# Patient Record
Sex: Male | Born: 1945 | Race: White | Hispanic: No | Smoking: Current every day smoker
Health system: Southern US, Community
[De-identification: ages and names within clinical notes are randomized; demographics above are authoritative.]

## PROBLEM LIST (undated history)

## (undated) DIAGNOSIS — I219 Acute myocardial infarction, unspecified: Secondary | ICD-10-CM

## (undated) DIAGNOSIS — I2510 Atherosclerotic heart disease of native coronary artery without angina pectoris: Secondary | ICD-10-CM

## (undated) DIAGNOSIS — I719 Aortic aneurysm of unspecified site, without rupture: Secondary | ICD-10-CM

## (undated) DIAGNOSIS — I509 Heart failure, unspecified: Secondary | ICD-10-CM

## (undated) DIAGNOSIS — Z951 Presence of aortocoronary bypass graft: Secondary | ICD-10-CM

## (undated) DIAGNOSIS — E119 Type 2 diabetes mellitus without complications: Secondary | ICD-10-CM

## (undated) HISTORY — PX: CORONARY ARTERY BYPASS GRAFT: SHX141

## (undated) HISTORY — PX: CARDIAC SURGERY: SHX584

---

## 2016-07-13 ENCOUNTER — Emergency Department: Payer: Medicare Other

## 2016-07-13 ENCOUNTER — Inpatient Hospital Stay
Admission: EM | Admit: 2016-07-13 | Discharge: 2016-07-20 | DRG: 292 | Disposition: A | Discharge status: Home / Self Care | Payer: Medicare Other | Attending: Internal Medicine | Admitting: Internal Medicine

## 2016-07-13 DIAGNOSIS — I4891 Unspecified atrial fibrillation: Secondary | ICD-10-CM | POA: Diagnosis present

## 2016-07-13 DIAGNOSIS — Z6832 Body mass index (BMI) 32.0-32.9, adult: Secondary | ICD-10-CM | POA: Diagnosis not present

## 2016-07-13 DIAGNOSIS — Z8249 Family history of ischemic heart disease and other diseases of the circulatory system: Secondary | ICD-10-CM | POA: Diagnosis not present

## 2016-07-13 DIAGNOSIS — I5023 Acute on chronic systolic (congestive) heart failure: Secondary | ICD-10-CM | POA: Diagnosis present

## 2016-07-13 DIAGNOSIS — L03116 Cellulitis of left lower limb: Secondary | ICD-10-CM | POA: Diagnosis present

## 2016-07-13 DIAGNOSIS — I252 Old myocardial infarction: Secondary | ICD-10-CM

## 2016-07-13 DIAGNOSIS — R0602 Shortness of breath: Secondary | ICD-10-CM

## 2016-07-13 DIAGNOSIS — I110 Hypertensive heart disease with heart failure: Secondary | ICD-10-CM | POA: Diagnosis not present

## 2016-07-13 DIAGNOSIS — F17210 Nicotine dependence, cigarettes, uncomplicated: Secondary | ICD-10-CM | POA: Diagnosis present

## 2016-07-13 DIAGNOSIS — I420 Dilated cardiomyopathy: Secondary | ICD-10-CM | POA: Diagnosis present

## 2016-07-13 DIAGNOSIS — M6281 Muscle weakness (generalized): Secondary | ICD-10-CM

## 2016-07-13 DIAGNOSIS — R2681 Unsteadiness on feet: Secondary | ICD-10-CM

## 2016-07-13 DIAGNOSIS — I509 Heart failure, unspecified: Secondary | ICD-10-CM

## 2016-07-13 DIAGNOSIS — L03115 Cellulitis of right lower limb: Secondary | ICD-10-CM | POA: Diagnosis present

## 2016-07-13 DIAGNOSIS — E785 Hyperlipidemia, unspecified: Secondary | ICD-10-CM | POA: Diagnosis present

## 2016-07-13 DIAGNOSIS — E669 Obesity, unspecified: Secondary | ICD-10-CM | POA: Diagnosis present

## 2016-07-13 DIAGNOSIS — I2510 Atherosclerotic heart disease of native coronary artery without angina pectoris: Secondary | ICD-10-CM | POA: Diagnosis present

## 2016-07-13 DIAGNOSIS — Z951 Presence of aortocoronary bypass graft: Secondary | ICD-10-CM

## 2016-07-13 DIAGNOSIS — E1140 Type 2 diabetes mellitus with diabetic neuropathy, unspecified: Secondary | ICD-10-CM | POA: Diagnosis present

## 2016-07-13 DIAGNOSIS — Z794 Long term (current) use of insulin: Secondary | ICD-10-CM

## 2016-07-13 DIAGNOSIS — I82409 Acute embolism and thrombosis of unspecified deep veins of unspecified lower extremity: Secondary | ICD-10-CM

## 2016-07-13 DIAGNOSIS — Z7982 Long term (current) use of aspirin: Secondary | ICD-10-CM

## 2016-07-13 DIAGNOSIS — M7989 Other specified soft tissue disorders: Secondary | ICD-10-CM

## 2016-07-13 HISTORY — DX: Heart failure, unspecified: I50.9

## 2016-07-13 HISTORY — DX: Aortic aneurysm of unspecified site, without rupture: I71.9

## 2016-07-13 HISTORY — DX: Presence of aortocoronary bypass graft: Z95.1

## 2016-07-13 HISTORY — DX: Type 2 diabetes mellitus without complications: E11.9

## 2016-07-13 HISTORY — DX: Acute myocardial infarction, unspecified: I21.9

## 2016-07-13 LAB — CBC WITH DIFFERENTIAL/PLATELET
Basophils Absolute: 0.1 10*3/uL (ref 0–0.1)
Basophils Relative: 1 %
EOS ABS: 0.2 10*3/uL (ref 0–0.7)
EOS PCT: 3 %
HCT: 41.9 % (ref 40.0–52.0)
HEMOGLOBIN: 14.1 g/dL (ref 13.0–18.0)
Lymphocytes Relative: 13 %
Lymphs Abs: 0.8 10*3/uL — ABNORMAL LOW (ref 1.0–3.6)
MCH: 34.2 pg — AB (ref 26.0–34.0)
MCHC: 33.6 g/dL (ref 32.0–36.0)
MCV: 101.9 fL — ABNORMAL HIGH (ref 80.0–100.0)
MONO ABS: 0.6 10*3/uL (ref 0.2–1.0)
MONOS PCT: 9 %
Neutro Abs: 4.6 10*3/uL (ref 1.4–6.5)
Neutrophils Relative %: 74 %
PLATELETS: 145 10*3/uL — AB (ref 150–440)
RBC: 4.11 MIL/uL — ABNORMAL LOW (ref 4.40–5.90)
RDW: 14.1 % (ref 11.5–14.5)
WBC: 6.3 10*3/uL (ref 3.8–10.6)

## 2016-07-13 LAB — COMPREHENSIVE METABOLIC PANEL
ALK PHOS: 96 U/L (ref 38–126)
ALT: 12 U/L — ABNORMAL LOW (ref 17–63)
ANION GAP: 8 (ref 5–15)
AST: 16 U/L (ref 15–41)
Albumin: 3.7 g/dL (ref 3.5–5.0)
BUN: 22 mg/dL — ABNORMAL HIGH (ref 6–20)
CALCIUM: 9.1 mg/dL (ref 8.9–10.3)
CO2: 28 mmol/L (ref 22–32)
Chloride: 99 mmol/L — ABNORMAL LOW (ref 101–111)
Creatinine, Ser: 1.13 mg/dL (ref 0.61–1.24)
GFR calc non Af Amer: >60 mL/min (ref >60)
Glucose, Bld: 118 mg/dL — ABNORMAL HIGH (ref 65–99)
Potassium: 4.1 mmol/L (ref 3.5–5.1)
SODIUM: 135 mmol/L (ref 135–145)
Total Bilirubin: 1.3 mg/dL — ABNORMAL HIGH (ref 0.3–1.2)
Total Protein: 7.1 g/dL (ref 6.5–8.1)

## 2016-07-13 LAB — URINALYSIS, COMPLETE (UACMP) WITH MICROSCOPIC
Bacteria, UA: NONE SEEN
SPECIFIC GRAVITY, URINE: 1.006 (ref 1.005–1.030)

## 2016-07-13 LAB — BRAIN NATRIURETIC PEPTIDE: B Natriuretic Peptide: 409 pg/mL — ABNORMAL HIGH (ref 0.0–100.0)

## 2016-07-13 LAB — TROPONIN I
Troponin I: <0.03 ng/mL (ref <0.03)
Troponin I: <0.03 ng/mL (ref <0.03)

## 2016-07-13 LAB — GLUCOSE, CAPILLARY: GLUCOSE-CAPILLARY: 126 mg/dL — AB (ref 65–99)

## 2016-07-13 MED ORDER — ACETAMINOPHEN 325 MG PO TABS
650.0000 mg | ORAL_TABLET | Freq: Four times a day (QID) | ORAL | Status: DC | PRN
  Administered 2016-07-20: 650 mg via ORAL
  Filled 2016-07-13: qty 2

## 2016-07-13 MED ORDER — ONDANSETRON HCL 4 MG PO TABS: 4.0000 mg | ORAL_TABLET | Freq: Four times a day (QID) | ORAL | Status: DC | PRN

## 2016-07-13 MED ORDER — DIAZEPAM 5 MG PO TABS
5.0000 mg | ORAL_TABLET | Freq: Every day | ORAL | Status: DC
  Administered 2016-07-13 – 2016-07-19 (×7): 5 mg via ORAL
  Filled 2016-07-13 (×7): qty 1

## 2016-07-13 MED ORDER — HYDROCODONE-ACETAMINOPHEN 5-325 MG PO TABS
1.0000 | ORAL_TABLET | Freq: Four times a day (QID) | ORAL | Status: DC | PRN
  Administered 2016-07-13 – 2016-07-18 (×16): 1 via ORAL
  Filled 2016-07-13 (×16): qty 1

## 2016-07-13 MED ORDER — ASPIRIN 81 MG PO CHEW
324.0000 mg | CHEWABLE_TABLET | Freq: Once | ORAL | Status: DC
  Filled 2016-07-13: qty 4

## 2016-07-13 MED ORDER — GABAPENTIN 300 MG PO CAPS
900.0000 mg | ORAL_CAPSULE | Freq: Every evening | ORAL | Status: DC
  Administered 2016-07-13 – 2016-07-19 (×8): 900 mg via ORAL
  Filled 2016-07-13 (×7): qty 3

## 2016-07-13 MED ORDER — GABAPENTIN 300 MG PO CAPS
600.0000 mg | ORAL_CAPSULE | Freq: Every morning | ORAL | Status: DC
  Administered 2016-07-14 – 2016-07-20 (×6): 600 mg via ORAL
  Filled 2016-07-13 (×8): qty 2

## 2016-07-13 MED ORDER — FUROSEMIDE 10 MG/ML IJ SOLN
60.0000 mg | Freq: Once | INTRAMUSCULAR | Status: AC
  Administered 2016-07-13: 60 mg via INTRAVENOUS
  Filled 2016-07-13: qty 8

## 2016-07-13 MED ORDER — SODIUM CHLORIDE 0.9% FLUSH
3.0000 mL | Freq: Two times a day (BID) | INTRAVENOUS | Status: DC
  Administered 2016-07-13 – 2016-07-19 (×13): 3 mL via INTRAVENOUS

## 2016-07-13 MED ORDER — SIMVASTATIN 40 MG PO TABS
40.0000 mg | ORAL_TABLET | Freq: Every day | ORAL | Status: DC
  Administered 2016-07-13 – 2016-07-20 (×8): 40 mg via ORAL
  Filled 2016-07-13 (×8): qty 1

## 2016-07-13 MED ORDER — ORAL CARE MOUTH RINSE
15.0000 mL | Freq: Two times a day (BID) | OROMUCOSAL | Status: DC
  Administered 2016-07-13 – 2016-07-19 (×10): 15 mL via OROMUCOSAL

## 2016-07-13 MED ORDER — AMLODIPINE BESYLATE 10 MG PO TABS
10.0000 mg | ORAL_TABLET | Freq: Every day | ORAL | Status: DC
  Administered 2016-07-14: 10 mg via ORAL
  Filled 2016-07-13: qty 1

## 2016-07-13 MED ORDER — ONDANSETRON HCL 4 MG/2ML IJ SOLN: 4.0000 mg | Freq: Four times a day (QID) | INTRAMUSCULAR | Status: DC | PRN

## 2016-07-13 MED ORDER — IRBESARTAN 75 MG PO TABS
300.0000 mg | ORAL_TABLET | Freq: Every day | ORAL | Status: DC
  Administered 2016-07-14: 300 mg via ORAL
  Filled 2016-07-13: qty 4

## 2016-07-13 MED ORDER — ACETAMINOPHEN 650 MG RE SUPP: 650.0000 mg | Freq: Four times a day (QID) | RECTAL | Status: DC | PRN

## 2016-07-13 MED ORDER — APIXABAN 5 MG PO TABS
5.0000 mg | ORAL_TABLET | Freq: Two times a day (BID) | ORAL | Status: DC
  Administered 2016-07-13 – 2016-07-20 (×14): 5 mg via ORAL
  Filled 2016-07-13 (×14): qty 1

## 2016-07-13 MED ORDER — CARVEDILOL 12.5 MG PO TABS
12.5000 mg | ORAL_TABLET | Freq: Two times a day (BID) | ORAL | Status: DC
  Administered 2016-07-14 – 2016-07-19 (×11): 12.5 mg via ORAL
  Filled 2016-07-13 (×11): qty 1

## 2016-07-13 MED ORDER — ASPIRIN 325 MG PO TABS
325.0000 mg | ORAL_TABLET | Freq: Every day | ORAL | Status: DC
  Administered 2016-07-14: 325 mg via ORAL
  Filled 2016-07-13: qty 1

## 2016-07-13 MED ORDER — INSULIN ASPART 100 UNIT/ML ~~LOC~~ SOLN
0.0000 [IU] | Freq: Every day | SUBCUTANEOUS | Status: DC
  Administered 2016-07-19: 2 [IU] via SUBCUTANEOUS
  Filled 2016-07-13: qty 2

## 2016-07-13 MED ORDER — FUROSEMIDE 10 MG/ML IJ SOLN
40.0000 mg | Freq: Three times a day (TID) | INTRAMUSCULAR | Status: DC
  Administered 2016-07-13 – 2016-07-16 (×8): 40 mg via INTRAVENOUS
  Filled 2016-07-13 (×8): qty 4

## 2016-07-13 MED ORDER — POTASSIUM CHLORIDE CRYS ER 10 MEQ PO TBCR
10.0000 meq | EXTENDED_RELEASE_TABLET | Freq: Two times a day (BID) | ORAL | Status: DC
  Administered 2016-07-13 – 2016-07-20 (×14): 10 meq via ORAL
  Filled 2016-07-13 (×14): qty 1

## 2016-07-13 MED ORDER — INSULIN ASPART 100 UNIT/ML ~~LOC~~ SOLN
0.0000 [IU] | Freq: Three times a day (TID) | SUBCUTANEOUS | Status: DC
  Administered 2016-07-14: 1 [IU] via SUBCUTANEOUS
  Administered 2016-07-15: 2 [IU] via SUBCUTANEOUS
  Administered 2016-07-15 (×2): 1 [IU] via SUBCUTANEOUS
  Administered 2016-07-16: 2 [IU] via SUBCUTANEOUS
  Administered 2016-07-17: 1 [IU] via SUBCUTANEOUS
  Administered 2016-07-17: 2 [IU] via SUBCUTANEOUS
  Administered 2016-07-17: 1 [IU] via SUBCUTANEOUS
  Administered 2016-07-18 (×2): 2 [IU] via SUBCUTANEOUS
  Administered 2016-07-18: 3 [IU] via SUBCUTANEOUS
  Administered 2016-07-19 (×2): 2 [IU] via SUBCUTANEOUS
  Administered 2016-07-19 – 2016-07-20 (×3): 3 [IU] via SUBCUTANEOUS
  Filled 2016-07-13: qty 2
  Filled 2016-07-13: qty 3
  Filled 2016-07-13: qty 2
  Filled 2016-07-13: qty 3
  Filled 2016-07-13: qty 2
  Filled 2016-07-13: qty 3
  Filled 2016-07-13: qty 1
  Filled 2016-07-13: qty 2
  Filled 2016-07-13 (×2): qty 1
  Filled 2016-07-13: qty 2
  Filled 2016-07-13: qty 1
  Filled 2016-07-13: qty 2
  Filled 2016-07-13: qty 1
  Filled 2016-07-13: qty 2
  Filled 2016-07-13: qty 3

## 2016-07-13 NOTE — ED Triage Notes (Signed)
Pt reports swelling in legs/feet that radiates into scrotum for 1 week - pt reports having difficulty urinating - c/o shortness of breath for 2 weeks

## 2016-07-13 NOTE — Progress Notes (Signed)
Patient admitted today from home with CHF exacerbation. Oriented to unit and room. Telemetry verified and vitals taken.

## 2016-07-13 NOTE — Progress Notes (Signed)
ANTICOAGULATION CONSULT NOTE - Initial Consult  Pharmacy Consult for apixaban Indication: atrial fibrillation  Not on File  Patient Measurements: Height: 5\' 11"  (180.3 cm) Weight: 230 lb (104.3 kg) IBW/kg (Calculated) : 75.3  Vital Signs: Temp: 97.6 F (36.4 C) (01/01 1508) Temp Source: Oral (01/01 1508) BP: 134/68 (01/01 1700) Pulse Rate: 113 (01/01 1700)  Labs:  Recent Labs  07/13/16 1355  HGB 14.1  HCT 41.9  PLT 145*  CREATININE 1.13  TROPONINI <0.03    Estimated Creatinine Clearance: 74.8 mL/min (by C-G formula based on SCr of 1.13 mg/dL).   Medical History: Past Medical History:  Diagnosis Date  . Aortic aneurysm (HCC)   . CHF (congestive heart failure) (HCC)   . Diabetes mellitus without complication (HCC)   . MI (myocardial infarction)   . S/P triple vessel bypass     Assessment: 71yo male with nonvalvular atrial fibrillation. Not previously on anticoagulation.   Goal of Therapy:  Monitor platelets by anticoagulation protocol: Yes   Plan:  Will initiate apixaban 5mg  PO BID to start tonight. Follow-up on CBC tomorrow.   Delsa BernKelly m Fuhrmann, PharmD 07/13/2016,5:24 PM

## 2016-07-13 NOTE — ED Provider Notes (Signed)
[facility] Emergency Department Provider Note  ____________________________________________   I have reviewed the triage vital signs and the nursing notes.   HISTORY  Chief Complaint Shortness of Breath and Leg Swelling    HPI Patrick Skinner is a 71 y.o. male who presents today complaining of shortness of breath over his baseline. Patient has an extensive cardiac history. He is on Lasix, which she has been increasing at home to try to take care of his edema. Patient has had bilateral lower extremity edema all the way up to his scrotum which has been getting worse over the last week. He has not had any recent chest pain. He states now and again he does have anginal events. His last one was several days ago. Last for maybe a half hour. He cannot very well described this discomfort.. It is left-sided discomfort which he has had multiple times in the past. He does have an extensive cardiac history including recurrent atrial fibrillation, he is on aspirin. He also has a history of cardiac stents and a AAA repair etc. Patient states that he is not having any chest pain. He does normally sleep with several pillows but he had to get up to get into a recliner yesterday. He is having exertional dyspnea. His his impression that is not always and atrial fibrillation. Patient is from [place], all of his cares from there, and his records do not appear to be available online.   Past Medical History:  Diagnosis Date  . Aortic aneurysm (HCC)   . CHF (congestive heart failure) (HCC)   . Diabetes mellitus without complication (HCC)   . MI (myocardial infarction)   . S/P triple vessel bypass     There are no active problems to display for this patient.   Past Surgical History:  Procedure Laterality Date  . CARDIAC SURGERY      Prior to Admission medications   Not on File    Allergies Patient has no allergy information on record.  No family history on  file.  Social History Social History  Substance Use Topics  . Smoking status: Current Every Day Smoker    Packs/day: 1.00    Types: Cigarettes  . Smokeless tobacco: Never Used  . Alcohol use Yes     Comment: social     Review of Systems Constitutional: No fever/chills Eyes: No visual changes. ENT: No sore throat. No stiff neck no neck pain Cardiovascular: Denies chest painToday. Respiratory: Positive shortness of breath. Gastrointestinal:   no vomiting.  No diarrhea.  No constipation. Genitourinary: Negative for dysuria. Musculoskeletal: Positive lower extremity swelling Skin: Negative for rash. Neurological: Negative for severe headaches, focal weakness or numbness. 10-point ROS otherwise negative.  ____________________________________________   PHYSICAL EXAM:  VITAL SIGNS: ED Triage Vitals  Enc Vitals Group     BP 07/13/16 1347 (!) 141/76     Pulse Rate 07/13/16 1347 (!) 112     Resp 07/13/16 1347 18     Temp 07/13/16 1347 97.3 F (36.3 C)     Temp Source 07/13/16 1347 Oral     SpO2 07/13/16 1347 98 %     Weight 07/13/16 1348 230 lb (104.3 kg)     Height 07/13/16 1348 5\' 11"  (1.803 m)     Head Circumference --      Peak Flow --      Pain Score 07/13/16 1348 0     Pain Loc --      Pain Edu? --  Excl. in GC? --     Constitutional: Alert and oriented. Well appearing and in no acute distress. Eyes: Conjunctivae are normal. PERRL. EOMI. Head: Atraumatic. Nose: No congestion/rhinnorhea. Mouth/Throat: Mucous membranes are moist.  Oropharynx non-erythematous. Neck: No stridor.   Nontender with no meningismus Cardiovascular: Normal rate, Irregularly irregular. Grossly normal heart sounds.  Good peripheral circulation. Respiratory: Rales noted in bibasilar regions, sats reasonable, no accessory muscle use or respiratory distress noted Abdominal: Soft and nontender. No distention. No guarding no rebound Back:  There is no focal tenderness or step off.  there is  no midline tenderness there are no lesions noted. there is no CVA tenderness GU: Anasarca changes in the scrotum and penis with swelling but no redness or tenderness Musculoskeletal: No lower extremity tenderness, no upper extremity tenderness. No joint effusions, no DVT signs strong distal pulses very significant bilateral lower extremity edema, pitting, symmetric, good pulses noted. There is Neurologic:  Normal speech and language. No gross focal neurologic deficits are appreciated.  Skin:  Skin is warm, dry and intact. Chronic-appearing bilateral skin changes including petechiae over the pretibial regions bilaterally. Psychiatric: Mood and affect are normal. Speech and behavior are normal.  ____________________________________________   LABS (all labs ordered are listed, but only abnormal results are displayed)  Labs Reviewed  CBC WITH DIFFERENTIAL/PLATELET - Abnormal; Notable for the following:       Result Value   RBC 4.11 (*)    MCV 101.9 (*)    MCH 34.2 (*)    Platelets 145 (*)    Lymphs Abs 0.8 (*)    All other components within normal limits  COMPREHENSIVE METABOLIC PANEL - Abnormal; Notable for the following:    Chloride 99 (*)    Glucose, Bld 118 (*)    BUN 22 (*)    ALT 12 (*)    Total Bilirubin 1.3 (*)    All other components within normal limits  BRAIN NATRIURETIC PEPTIDE - Abnormal; Notable for the following:    B Natriuretic Peptide 409.0 (*)    All other components within normal limits  URINALYSIS, COMPLETE (UACMP) WITH MICROSCOPIC - Abnormal; Notable for the following:    Color, Urine ORANGE (*)    APPearance CLEAR (*)    Glucose, UA   (*)    Value: TEST NOT REPORTED DUE TO COLOR INTERFERENCE OF URINE PIGMENT   Hgb urine dipstick   (*)    Value: TEST NOT REPORTED DUE TO COLOR INTERFERENCE OF URINE PIGMENT   Bilirubin Urine   (*)    Value: TEST NOT REPORTED DUE TO COLOR INTERFERENCE OF URINE PIGMENT   Ketones, ur   (*)    Value: TEST NOT REPORTED DUE TO COLOR  INTERFERENCE OF URINE PIGMENT   Protein, ur   (*)    Value: TEST NOT REPORTED DUE TO COLOR INTERFERENCE OF URINE PIGMENT   Nitrite   (*)    Value: TEST NOT REPORTED DUE TO COLOR INTERFERENCE OF URINE PIGMENT   Leukocytes, UA   (*)    Value: TEST NOT REPORTED DUE TO COLOR INTERFERENCE OF URINE PIGMENT   Squamous Epithelial / LPF 0-5 (*)    All other components within normal limits  TROPONIN I   ____________________________________________  EKG  I personally interpreted any EKGs ordered by me or triage Atrial fibrillation, rate 87 bpm, no acute ischemic changes, normal axis, borderline right bundle branch block. ____________________________________________  RADIOLOGY  I reviewed any imaging ordered by me or triage that were performed during  my shift and, if possible, patient and/or family made aware of any abnormal findings. ____________________________________________   PROCEDURES  Procedure(s) performed: None  Procedures  Critical Care performed: CRITICAL CARE Performed by: Jeanmarie PlantJAMES A Fermina Mishkin   Total critical care time: 36 minutes  Critical care time was exclusive of separately billable procedures and treating other patients.  Critical care was necessary to treat or prevent imminent or life-threatening deterioration.  Critical care was time spent personally by me on the following activities: development of treatment plan with patient and/or surrogate as well as nursing, discussions with consultants, evaluation of patient's response to treatment, examination of patient, obtaining history from patient or surrogate, ordering and performing treatments and interventions, ordering and review of laboratory studies, ordering and review of radiographic studies, pulse oximetry and re-evaluation of patient's condition.   ____________________________________________   INITIAL IMPRESSION / ASSESSMENT AND PLAN / ED COURSE  Pertinent labs & imaging results that were available during my  care of the patient were reviewed by me and considered in my medical decision making (see chart for details).  Patient with the Stated CHF. Will require diuresis and an inpatient basis. No evidence of acute ischemic event at this time. Unclear why the patient has become more edematous. He could've gone back into atrial fibrillation that has caused it or perhaps he has had silent ischemia, or perhaps he has had a significant change in diet over the holidays.  Patient is able to void as his bladder and penis are right now. It was very difficult to place a Foley given the degree of anasarca. As he can urinate may have verified this with a bladder scanner which showed around 100 cc, post void residual, we will see if we can give him Lasix without attempting a Foley initially.   Clinical Course    ____________________________________________   FINAL CLINICAL IMPRESSION(S) / ED DIAGNOSES  Final diagnoses:  None      This chart was dictated using voice recognition software.  Despite best efforts to proofread,  errors can occur which can change meaning.      Jeanmarie PlantJames A Dawnielle Christiana, MD 07/13/16 1620

## 2016-07-13 NOTE — ED Notes (Signed)
Bladder scan pt urinates about 30 mins prior

## 2016-07-13 NOTE — ED Notes (Signed)
Report given to Crystal RN 

## 2016-07-13 NOTE — H&P (Signed)
[facility] [place] [facility]   PATIENT NAME: Patrick Skinner    MR#:  [id number]  DATE OF BIRTH:  [birth year]  DATE OF ADMISSION:  07/13/2016  PRIMARY CARE PHYSICIAN: Non-local   REQUESTING/REFERRING PHYSICIAN:  Dr. Ileana Roup  CHIEF COMPLAINT:   Chief Complaint  Patient presents with  . Shortness of Breath  . Leg Swelling    HISTORY OF PRESENT ILLNESS:  Patrick Skinner  is a 71 y.o. male with a known history of CHF, coronary artery disease status post bypass, history of previous MI, diabetes, history of aortic aneurysm repair who presents to the hospital due to shortness of breath, worsening lower extremity edema. Patient is visiting his family from [place] and is normally not short of breath. Shortness of breath now even on minimal exertion. He has had about a 10-15 pound weight gain over the past 2 weeks. He also admits to a cough which is productive with clear sputum, some mild paroxysmal nocturnal dyspnea and a baseline two-pillow orthopnea. He denies any chest pain, nausea, vomiting, palpitations or any other associated symptoms. Patient presented to the emergency room was noted to be in congestive heart failure and volume overloaded and hospitalist services were contacted further treatment and evaluation.  PAST MEDICAL HISTORY:   Past Medical History:  Diagnosis Date  . Aortic aneurysm (HCC)   . CHF (congestive heart failure) (HCC)   . Diabetes mellitus without complication (HCC)   . MI (myocardial infarction)   . S/P triple vessel bypass     PAST SURGICAL HISTORY:   Past Surgical History:  Procedure Laterality Date  . CARDIAC SURGERY      SOCIAL HISTORY:   Social History  Substance Use Topics  . Smoking status: Current Every Day Smoker    Packs/day: 1.00    Years: 55.00    Types: Cigarettes  . Smokeless tobacco: Never Used  . Alcohol use Yes     Comment: social     FAMILY HISTORY:   Family History  Problem Relation Age of  Onset  . Heart failure Mother   . Lung cancer Mother   . Heart attack Father     DRUG ALLERGIES:  Not on File  REVIEW OF SYSTEMS:   Review of Systems  Constitutional: Negative for fever and weight loss.  HENT: Negative for congestion, nosebleeds and tinnitus.   Eyes: Negative for blurred vision, double vision and redness.  Respiratory: Positive for shortness of breath. Negative for cough and hemoptysis.   Cardiovascular: Positive for orthopnea, leg swelling and PND. Negative for chest pain.  Gastrointestinal: Negative for abdominal pain, diarrhea, melena, nausea and vomiting.  Genitourinary: Negative for dysuria, hematuria and urgency.  Musculoskeletal: Negative for falls and joint pain.  Neurological: Negative for dizziness, tingling, sensory change, focal weakness, seizures, weakness and headaches.  Endo/Heme/Allergies: Negative for polydipsia. Does not bruise/bleed easily.  Psychiatric/Behavioral: Negative for depression and memory loss. The patient is not nervous/anxious.     MEDICATIONS AT HOME:   Prior to Admission medications   Medication Sig Start Date End Date Taking? Authorizing Provider  albuterol (PROVENTIL HFA;VENTOLIN HFA) 108 (90 Base) MCG/ACT inhaler Inhale 2 puffs into the lungs every 6 (six) hours as needed for wheezing or shortness of breath.   Yes Historical Provider, MD  amLODipine (NORVASC) 10 MG tablet Take 10 mg by mouth daily.   Yes Historical Provider, MD  aspirin 325 MG tablet Take 325 mg by mouth daily.   Yes Historical Provider, MD  carvedilol (  COREG) 12.5 MG tablet Take 12.5 mg by mouth 2 (two) times daily with a meal.   Yes Historical Provider, MD  diazepam (VALIUM) 5 MG tablet Take 5 mg by mouth daily.   Yes Historical Provider, MD  ergocalciferol (VITAMIN D2) 50000 units capsule Take 50,000 Units by mouth once a week.   Yes Historical Provider, MD  furosemide (LASIX) 20 MG tablet Take 40 mg by mouth daily.   Yes Historical Provider, MD  gabapentin  (NEURONTIN) 600 MG tablet Take 600-900 mg by mouth 2 (two) times daily. Take 600 mg in the morning and 900 mg in the evening.   Yes Historical Provider, MD  Insulin Degludec (TRESIBA FLEXTOUCH Novelty) Inject 20 Units into the skin at bedtime.   Yes Historical Provider, MD  irbesartan (AVAPRO) 300 MG tablet Take 300 mg by mouth daily.   Yes Historical Provider, MD  Phenazopyridine HCl (URISTAT PO) Take 1 tablet by mouth daily.   Yes Historical Provider, MD  simvastatin (ZOCOR) 40 MG tablet Take 40 mg by mouth daily.   Yes Historical Provider, MD      VITAL SIGNS:  Blood pressure 130/73, pulse 90, temperature 97.6 F (36.4 C), temperature source Oral, resp. rate 18, height 5\' 11"  (1.803 m), weight 104.3 kg (230 lb), SpO2 94 %.  PHYSICAL EXAMINATION:  Physical Exam  GENERAL:  71 y.o.-year-old obese patient lying in the bed in no acute distress.  EYES: Pupils equal, round, reactive to light and accommodation. No scleral icterus. Extraocular muscles intact.  HEENT: Head atraumatic, normocephalic. Oropharynx and nasopharynx clear. No oropharyngeal erythema, moist oral mucosa  NECK:  Supple, + jugular venous distention. No thyroid enlargement, no tenderness.  LUNGS: Normal breath sounds bilaterally, no wheezing, rales, rhonchi. No use of accessory muscles of respiration.  CARDIOVASCULAR: S1, S2 RRR. No murmurs, rubs, gallops, clicks.  ABDOMEN: Soft, nontender, nondistended. Bowel sounds present. No organomegaly or mass.  EXTREMITIES: + 2 edema b/l, No cyanosis, or clubbing. + 2 pedal & radial pulses b/l.   NEUROLOGIC: Cranial nerves II through XII are intact. No focal Motor or sensory deficits appreciated b/l PSYCHIATRIC: The patient is alert and oriented x 3. Good affect.  SKIN: No obvious rash, lesion, or ulcer.   GU - + Scrotal and penile edema, Anasarca  LABORATORY PANEL:   CBC  Recent Labs Lab 07/13/16 1355  WBC 6.3  HGB 14.1  HCT 41.9  PLT 145*    ------------------------------------------------------------------------------------------------------------------  Chemistries   Recent Labs Lab 07/13/16 1355  NA 135  K 4.1  CL 99*  CO2 28  GLUCOSE 118*  BUN 22*  CREATININE 1.13  CALCIUM 9.1  AST 16  ALT 12*  ALKPHOS 96  BILITOT 1.3*   ------------------------------------------------------------------------------------------------------------------  Cardiac Enzymes  Recent Labs Lab 07/13/16 1355  TROPONINI <0.03   ------------------------------------------------------------------------------------------------------------------  RADIOLOGY:  Dg Chest 2 View  Result Date: 07/13/2016 CLINICAL DATA:  Shortness of breath and productive cough. Bilateral lower extremity edema. History of coronary artery disease and CABG. EXAM: CHEST  2 VIEW COMPARISON:  None. FINDINGS: The heart is mildly enlarged and there is evidence of prior CABG. Lungs show evidence of diffuse interstitial edema and small bilateral pleural effusions, left greater than right. Bibasilar atelectasis/ scarring present. No pneumothorax or nodules identified. Visualized bony structures are unremarkable. IMPRESSION: Diffuse interstitial edema with bilateral pleural effusions. Mild cardiac enlargement and evidence of prior CABG. Electronically Signed   By: Irish Lack M.D.   On: 07/13/2016 14:32     IMPRESSION AND  PLAN:   71 year old male with past medical history of obesity, diabetes, hypertension, coronary artery disease status post bypass, history of previous MI, CHF who presents to the hospital due to shortness of breath, lower extremity edema.  1. CHF-this is a cause of patient's worsening lower extremity edema, anasarca and scrotal edema. -We'll aggressively diurese him with IV Lasix and follow I's and O's and daily weights. -Continue Coreg, irbesartan. -I will check a two-dimensional echocardiogram, get a cardiology consult.  2. Atrial  fibrillation-patient has had a previous history of atrial fibrillation but was cardioverted years ago when he had his bypass. He is currently not on anticoagulation. He is rate controlled. -I will continue carvedilol. We'll start him on anticoagulation with Eliquis. Consult cardiology. Next-check a two-dimensional echocardiogram.  3. 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  4. Essential hypertension-continue carvedilol, Norvasc, irbesartan.  5. Diabetic neuropathy-continue gabapentin.  Experience. Hyperlipidemia-continue simvastatin.    All the records are reviewed and case discussed with ED provider. Management plans discussed with the patient, family and they are in agreement.  CODE STATUS: Full Code  TOTAL TIME TAKING CARE OF THIS PATIENT: 45 minutes.    Houston SirenSAINANI,Jamaul Heist J M.D on 07/13/2016 at 4:55 PM  Between 7am to 6pm - Pager - [phone]  After 6pm go to [email or web address] - password [email or web address] [facility]  [facility]  Office  [phone]  CC: Primary care physician; No PCP Per Patient

## 2016-07-14 ENCOUNTER — Inpatient Hospital Stay
Admit: 2016-07-14 | Discharge: 2016-07-14 | Disposition: A | Discharge status: Home / Self Care | Payer: Medicare Other | Attending: Specialist | Admitting: Specialist

## 2016-07-14 LAB — GLUCOSE, CAPILLARY
GLUCOSE-CAPILLARY: 110 mg/dL — AB (ref 65–99)
Glucose-Capillary: 93 mg/dL (ref 65–99)
Glucose-Capillary: 121 mg/dL — ABNORMAL HIGH (ref 65–99)
Glucose-Capillary: 178 mg/dL — ABNORMAL HIGH (ref 65–99)

## 2016-07-14 LAB — BASIC METABOLIC PANEL
Anion gap: 8 (ref 5–15)
BUN: 22 mg/dL — AB (ref 6–20)
CHLORIDE: 101 mmol/L (ref 101–111)
CO2: 30 mmol/L (ref 22–32)
Calcium: 9 mg/dL (ref 8.9–10.3)
Creatinine, Ser: 0.91 mg/dL (ref 0.61–1.24)
GFR calc Af Amer: >60 mL/min (ref >60)
Glucose, Bld: 128 mg/dL — ABNORMAL HIGH (ref 65–99)
POTASSIUM: 3.5 mmol/L (ref 3.5–5.1)
SODIUM: 139 mmol/L (ref 135–145)

## 2016-07-14 LAB — CBC
HCT: 40.1 % (ref 40.0–52.0)
HEMOGLOBIN: 13.5 g/dL (ref 13.0–18.0)
MCH: 34.2 pg — AB (ref 26.0–34.0)
MCHC: 33.7 g/dL (ref 32.0–36.0)
MCV: 101.6 fL — AB (ref 80.0–100.0)
Platelets: 143 10*3/uL — ABNORMAL LOW (ref 150–440)
RBC: 3.95 MIL/uL — AB (ref 4.40–5.90)
RDW: 14 % (ref 11.5–14.5)
WBC: 4.8 10*3/uL (ref 3.8–10.6)

## 2016-07-14 LAB — ECHOCARDIOGRAM COMPLETE
HEIGHTINCHES: 71 in
Weight: 3844.8 oz

## 2016-07-14 LAB — TROPONIN I

## 2016-07-14 MED ORDER — GUAIFENESIN-DM 100-10 MG/5ML PO SYRP
5.0000 mL | ORAL_SOLUTION | ORAL | Status: DC | PRN
  Administered 2016-07-14 – 2016-07-15 (×4): 5 mL via ORAL
  Filled 2016-07-14 (×4): qty 5

## 2016-07-14 MED ORDER — SPIRONOLACTONE 25 MG PO TABS
25.0000 mg | ORAL_TABLET | Freq: Every day | ORAL | Status: DC
  Administered 2016-07-14 – 2016-07-20 (×7): 25 mg via ORAL
  Filled 2016-07-14 (×7): qty 1

## 2016-07-14 MED ORDER — ASPIRIN EC 81 MG PO TBEC
81.0000 mg | DELAYED_RELEASE_TABLET | Freq: Every day | ORAL | Status: DC
  Administered 2016-07-15 – 2016-07-20 (×6): 81 mg via ORAL
  Filled 2016-07-14 (×6): qty 1

## 2016-07-14 MED ORDER — AMLODIPINE BESYLATE 5 MG PO TABS: 5.0000 mg | ORAL_TABLET | Freq: Every day | ORAL | Status: DC

## 2016-07-14 NOTE — Consult Note (Signed)
[facility] CLINIC CARDIOLOGY [facility]  CARDIOLOGY CONSULT NOTE  Patient ID: Patrick Skinner MRN: [id number] DOB/AGE: 71-31-1947 71 y.o.  Admit date: 07/13/2016 Referring Physician Dr. Cherlynn Kaiser Primary Physician   Primary Cardiologist   Reason for Consultation chf  HPI: Pt Is a 71 year old male visiting his family from [place] who is admitted with progressive lower extremity edema and shortness of breath. Patient has an extensive cardiovascular history status post coronary artery bypass grafting at [facility] in the past, history of ruptured abdominal aortic aneurysm treated percutaneously with multiple repeat procedures for leaks, history of congestive heart failure, diabetes. Patient states he is not currently being seen by a cardiologist at his home in [place]. He has developed rather sudden onset of worsening peripheral edema and scrotal edema as well as shortness of breath. Chest x-ray on presentation to the emergency room showed diffuse interstitial edema with bilateral pleural effusions. He had mild cardiac enlargement. EKG showed atrial fibrillation with variable ventricular response. His chads score is at least 4-5. He has no absolute contraindication to anticoagulation. Echocardiogram revealed severely reduced LV function EF 25-30% with global hypokinesis as well as apical distal septal akinesis with possible apical thrombus although not completely visible. Patient was admitted and placed on IV diuresis as well as carvedilol and Eliquis. His rate is well controlled. He is currently on her to start as well as amlodipine 10 mg daily. Will reduce amlodipine to 5 mg due to peripheral edema. Consideration for the addition of spironolactone.  Review of Systems  Constitutional: Negative.   HENT: Negative.   Eyes: Negative.   Respiratory: Positive for shortness of breath.   Cardiovascular: Positive for leg swelling.  Gastrointestinal: Negative.    Genitourinary: Negative.   Musculoskeletal: Negative.   Skin: Negative.   Neurological: Negative.   Endo/Heme/Allergies: Negative.   Psychiatric/Behavioral: Negative.     Past Medical History:  Diagnosis Date  . Aortic aneurysm (HCC)   . CHF (congestive heart failure) (HCC)   . Diabetes mellitus without complication (HCC)   . MI (myocardial infarction)   . S/P triple vessel bypass     Family History  Problem Relation Age of Onset  . Heart failure Mother   . Lung cancer Mother   . Heart attack Father     Social History   Social History  . Marital status: Legally Separated    Spouse name: N/A  . Number of children: N/A  . Years of education: N/A   Occupational History  . Not on file.   Social History Main Topics  . Smoking status: Current Every Day Smoker    Packs/day: 1.00    Years: 55.00    Types: Cigarettes  . Smokeless tobacco: Never Used  . Alcohol use Yes     Comment: social   . Drug use: No  . Sexual activity: Not on file   Other Topics Concern  . Not on file   Social History Narrative  . No narrative on file    Past Surgical History:  Procedure Laterality Date  . CARDIAC SURGERY       Prescriptions Prior to Admission  Medication Sig Dispense Refill Last Dose  . albuterol (PROVENTIL HFA;VENTOLIN HFA) 108 (90 Base) MCG/ACT inhaler Inhale 2 puffs into the lungs every 6 (six) hours as needed for wheezing or shortness of breath.   prn at prn  . amLODipine (NORVASC) 10 MG tablet Take 10 mg by mouth daily.   07/13/2016 at 0800  .  aspirin 325 MG tablet Take 325 mg by mouth daily.   07/13/2016 at 0800  . carvedilol (COREG) 12.5 MG tablet Take 12.5 mg by mouth 2 (two) times daily with a meal.   07/13/2016 at 0800  . diazepam (VALIUM) 5 MG tablet Take 5 mg by mouth daily.   07/13/2016 at 0800  . ergocalciferol (VITAMIN D2) 50000 units capsule Take 50,000 Units by mouth once a week.   07/12/2016 at 0800  . furosemide (LASIX) 20 MG tablet Take 40 mg by mouth daily.    07/13/2016 at 0800  . gabapentin (NEURONTIN) 600 MG tablet Take 600-900 mg by mouth 2 (two) times daily. Take 600 mg in the morning and 900 mg in the evening.   07/13/2016 at 0800  . Insulin Degludec (TRESIBA FLEXTOUCH [place]) Inject 20 Units into the skin at bedtime.   07/12/2016 at 2000  . irbesartan (AVAPRO) 300 MG tablet Take 300 mg by mouth daily.   07/13/2016 at 0800  . Phenazopyridine HCl (URISTAT PO) Take 1 tablet by mouth daily.   07/13/2016 at 0800  . simvastatin (ZOCOR) 40 MG tablet Take 40 mg by mouth daily.   07/12/2016 at 2000    Physical Exam: Blood pressure 120/68, pulse 78, temperature 97.5 F (36.4 C), temperature source Oral, resp. rate 18, height 5\' 11"  (1.803 m), weight 240 lb 4.8 oz (109 kg), SpO2 96 %.   Wt Readings from Last 1 Encounters:  07/14/16 240 lb 4.8 oz (109 kg)     General appearance: alert and cooperative Head: Normocephalic, without obvious abnormality, atraumatic Resp: rales bilaterally Cardio: irregularly irregular rhythm GI: soft, non-tender; bowel sounds normal; no masses,  no organomegaly Extremities: edema 4+ lower extremity edema bilaterally up to his waist Neurologic: Grossly normal  Labs:   Lab Results  Component Value Date   WBC 4.8 07/14/2016   HGB 13.5 07/14/2016   HCT 40.1 07/14/2016   MCV 101.6 (H) 07/14/2016   PLT 143 (L) 07/14/2016    Recent Labs Lab 07/13/16 1355 07/14/16 0240  NA 135 139  K 4.1 3.5  CL 99* 101  CO2 28 30  BUN 22* 22*  CREATININE 1.13 0.91  CALCIUM 9.1 9.0  PROT 7.1  --   BILITOT 1.3*  --   ALKPHOS 96  --   ALT 12*  --   AST 16  --   GLUCOSE 118* 128*   Lab Results  Component Value Date   TROPONINI <0.03 07/14/2016      Radiology: Diffuse interstitial edema with bilateral pleural effusions left greater than right.   EKG: Atrial fibrillation with controlled ventricular response.  ASSESSMENT AND PLAN:  71 year old male with history of peripheral vascular disease status post abdominal aortic aneurysm  repair, history of coronary artery disease status post coronary artery bypass grafting, history of atrial fibrillation with good rate control, now admitted with worsening peripheral edema. He also has evidence of mild pulmonary edema. Echocardiogram reveals severely reduced LV function EF is less than 30%. He has evidence of septal and apical hypokinesis to akinesis with possible apical thrombus. His atrial fibrillation rate is controlled he is not currently anticoagulated. We'll place on Eliquis at 5 mg twice daily and follow for evidence of bleeding. We'll continue to control rate with carvedilol. Will reduce amlodipine to 5 mg daily and add spironolactone at 25 mg daily. Continue with diuresis. Continue with irbesartan for afterload reduction. After adequate control of his heart failure symptoms and evidence based medicines, consideration for invasive versus  noninvasive ischemic workup could raised however the patient has ruled out for myocardial infarction. This appears to be an exacerbation of chronic systolic heart failure. Patient is from out of town. Will attempt to optimize care and stabilize the patient for consideration for further follow-up and his home environment when safe. Signed: Dalia Heading MD, [facility] 07/14/2016, 12:22 PM

## 2016-07-14 NOTE — Progress Notes (Signed)
Pt requesting to have all of his morning medications at this time

## 2016-07-14 NOTE — Care Management Important Message (Signed)
Important Message  Patient Details  Name: Patrick Skinner Kernan MRN: [id number] Date of Birth: 06/27/1946   Medicare Important Message Given:  Yes  Initial signed IM printed from Epic and given to patient.     Eber HongGreene, Margot Oriordan R, RN 07/14/2016, 9:33 AM

## 2016-07-14 NOTE — Progress Notes (Signed)
*  PRELIMINARY RESULTS* Echocardiogram 2D Echocardiogram has been performed.  Patrick Skinner, Patrick Skinner 07/14/2016, 9:18 AM

## 2016-07-14 NOTE — Progress Notes (Signed)
To cardio for 2d echo

## 2016-07-14 NOTE — Progress Notes (Signed)
[facility] - [place] [facility]   PATIENT NAME: Patrick Skinner    MR#:  [id number]  DATE OF BIRTH:  1945/12/31  SUBJECTIVE:  CHIEF COMPLAINT:  Patient is visiting his family members here, came from [place] Shortness of breath is slightly better, concerned about lower extremity swelling  REVIEW OF SYSTEMS:  CONSTITUTIONAL: No fever, fatigue or weakness.  EYES: No blurred or double vision.  EARS, NOSE, AND THROAT: No tinnitus or ear pain.  RESPIRATORY: No cough, shortness of breathWhile resting, no wheezing or hemoptysis.  CARDIOVASCULAR: No chest pain, orthopnea, edema.  GASTROINTESTINAL: No nausea, vomiting, diarrhea or abdominal pain.  GENITOURINARY: No dysuria, hematuria.  ENDOCRINE: No polyuria, nocturia,  HEMATOLOGY: No anemia, easy bruising or bleeding SKIN:Is reporting scrotal edema, patient deferred physical examination No rash or lesion. MUSCULOSKELETAL: Reporting significant lower extremity swelling No joint pain or arthritis.   NEUROLOGIC: No tingling, numbness, weakness.  PSYCHIATRY: No anxiety or depression.   DRUG ALLERGIES:  Not on File  VITALS:  Blood pressure 120/68, pulse 78, temperature 97.5 F (36.4 C), temperature source Oral, resp. rate 18, height 5\' 11"  (1.803 m), weight 109 kg (240 lb 4.8 oz), SpO2 96 %.  PHYSICAL EXAMINATION:  GENERAL:  71 y.o.-year-old patient lying in the bed with no acute distress.  EYES: Pupils equal, round, reactive to light and accommodation. No scleral icterus. Extraocular muscles intact.  HEENT: Head atraumatic, normocephalic. Oropharynx and nasopharynx clear.  NECK:  Supple, no jugular venous distention. No thyroid enlargement, no tenderness.  LUNGS: Moderate breath sounds bilaterally, no wheezing, rales,rhonchi or crepitation. No use of accessory muscles of respiration.  CARDIOVASCULAR: Irregularly irregular. No murmurs, rubs, or gallops.  ABDOMEN: Soft, nontender, nondistended. Bowel sounds  present. No organomegaly or mass.  EXTREMITIES: 3+ pedal edema, no cyanosis, or clubbing.  NEUROLOGIC: Cranial nerves II through XII are intact. Muscle strength 5/5 in all extremities. Sensation intact. Gait not checked.  PSYCHIATRIC: The patient is alert and oriented x 3.  SKIN: No obvious rash, lesion, or ulcer.    LABORATORY PANEL:   CBC  Recent Labs Lab 07/14/16 0240  WBC 4.8  HGB 13.5  HCT 40.1  PLT 143*   ------------------------------------------------------------------------------------------------------------------  Chemistries   Recent Labs Lab 07/13/16 1355 07/14/16 0240  NA 135 139  K 4.1 3.5  CL 99* 101  CO2 28 30  GLUCOSE 118* 128*  BUN 22* 22*  CREATININE 1.13 0.91  CALCIUM 9.1 9.0  AST 16  --   ALT 12*  --   ALKPHOS 96  --   BILITOT 1.3*  --    ------------------------------------------------------------------------------------------------------------------  Cardiac Enzymes  Recent Labs Lab 07/14/16 0240  TROPONINI <0.03   ------------------------------------------------------------------------------------------------------------------  RADIOLOGY:  Dg Chest 2 View  Result Date: 07/13/2016 CLINICAL DATA:  Shortness of breath and productive cough. Bilateral lower extremity edema. History of coronary artery disease and CABG. EXAM: CHEST  2 VIEW COMPARISON:  None. FINDINGS: The heart is mildly enlarged and there is evidence of prior CABG. Lungs show evidence of diffuse interstitial edema and small bilateral pleural effusions, left greater than right. Bibasilar atelectasis/ scarring present. No pneumothorax or nodules identified. Visualized bony structures are unremarkable. IMPRESSION: Diffuse interstitial edema with bilateral pleural effusions. Mild cardiac enlargement and evidence of prior CABG. Electronically Signed   By: Irish Lack M.D.   On: 07/13/2016 14:32    EKG:   Orders placed or performed during the hospital encounter of 07/13/16   . ED EKG  . ED EKG  .  EKG 12-Lead  . EKG 12-Lead    ASSESSMENT AND PLAN:   71 year old male from [place] with past medical history of obesity, diabetes, hypertension, coronary artery disease status post bypass, history of previous MI, CHF who presents to the hospital due to shortness of breath, lower extremity edema.  1. Acute systolic CHF exacerbation with anasarca and scrotal edema. -Echocardiogram with left ventricular ejection fraction 25-30% -We'll aggressively diurese him with IV Lasix and follow I's and O's and daily weights. -Continue Coreg, irbesartan. -Amlodipine dose reduced to 5 mg -Spironolactone 25 mg is added to the regimen -[facility] -Monitor intake and output   2. Atrial fibrillation-patient has had a previous history of atrial fibrillation but was cardioverted years ago when he had his bypass.  -He is rate controlled. -continue carvedilol.  - started him on anticoagulation with Eliquis 5 mg by mouth twice a day  Cardiology is agreeable   3. 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  4. Essential hypertension-continue carvedilol, Norvasc dose reduced to 5 mg, irbesartan.  5. Diabetic neuropathy-continue gabapentin.  6. Hyperlipidemia-continue simvastatin.     All the records are reviewed and case discussed with Care Management/Social Workerr. Management plans discussed with the patient, family and they are in agreement.  CODE STATUS: fc  TOTAL TIME TAKING CARE OF THIS PATIENT: 36  minutes.   POSSIBLE D/C IN 2  DAYS, DEPENDING ON CLINICAL CONDITION.  Note: This dictation was prepared with Dragon dictation along with smaller phrase technology. Any transcriptional errors that result from this process are unintentional.   Ramonita LabGouru, Tisha Cline M.D on 07/14/2016 at 2:04 PM  Between 7am to 6pm - Pager - [phone] After 6pm go to [email or web address] - password [email or web address] [facility]  [place]  [facility]  Office  [phone]  CC: Primary care physician; No PCP Per Patient

## 2016-07-14 NOTE — Plan of Care (Signed)
Problem: Safety: Goal: Ability to remain free from injury will improve Outcome: Progressing Fall precautions in place  Problem: Pain Managment: Goal: General experience of comfort will improve Outcome: Not Progressing Chronic pain, prn medications  Problem: Tissue Perfusion: Goal: Risk factors for ineffective tissue perfusion will decrease Outcome: Progressing Po Eliquis

## 2016-07-15 ENCOUNTER — Inpatient Hospital Stay: Payer: Medicare Other

## 2016-07-15 LAB — CBC
HCT: 39.4 % — ABNORMAL LOW (ref 40.0–52.0)
Hemoglobin: 13.4 g/dL (ref 13.0–18.0)
MCH: 34.1 pg — ABNORMAL HIGH (ref 26.0–34.0)
MCHC: 34 g/dL (ref 32.0–36.0)
MCV: 100.3 fL — ABNORMAL HIGH (ref 80.0–100.0)
PLATELETS: 131 10*3/uL — AB (ref 150–440)
RBC: 3.93 MIL/uL — AB (ref 4.40–5.90)
RDW: 14 % (ref 11.5–14.5)
WBC: 5 10*3/uL (ref 3.8–10.6)

## 2016-07-15 LAB — MAGNESIUM: MAGNESIUM: 1.6 mg/dL — AB (ref 1.7–2.4)

## 2016-07-15 LAB — BASIC METABOLIC PANEL
ANION GAP: 7 (ref 5–15)
BUN: 21 mg/dL — ABNORMAL HIGH (ref 6–20)
CALCIUM: 9 mg/dL (ref 8.9–10.3)
CO2: 32 mmol/L (ref 22–32)
Chloride: 101 mmol/L (ref 101–111)
Creatinine, Ser: 1.09 mg/dL (ref 0.61–1.24)
GLUCOSE: 123 mg/dL — AB (ref 65–99)
POTASSIUM: 3.6 mmol/L (ref 3.5–5.1)
SODIUM: 140 mmol/L (ref 135–145)

## 2016-07-15 LAB — GLUCOSE, CAPILLARY
GLUCOSE-CAPILLARY: 133 mg/dL — AB (ref 65–99)
GLUCOSE-CAPILLARY: 159 mg/dL — AB (ref 65–99)
Glucose-Capillary: 126 mg/dL — ABNORMAL HIGH (ref 65–99)
Glucose-Capillary: 160 mg/dL — ABNORMAL HIGH (ref 65–99)

## 2016-07-15 MED ORDER — SACUBITRIL-VALSARTAN 24-26 MG PO TABS
1.0000 | ORAL_TABLET | Freq: Two times a day (BID) | ORAL | Status: DC
  Administered 2016-07-15 – 2016-07-20 (×11): 1 via ORAL
  Filled 2016-07-15 (×11): qty 1

## 2016-07-15 MED ORDER — MAGNESIUM SULFATE IN D5W 1-5 GM/100ML-% IV SOLN
1.0000 g | Freq: Once | INTRAVENOUS | Status: AC
  Administered 2016-07-15: 1 g via INTRAVENOUS
  Filled 2016-07-15: qty 100

## 2016-07-15 MED ORDER — GUAIFENESIN-DM 100-10 MG/5ML PO SYRP
10.0000 mL | ORAL_SOLUTION | ORAL | Status: DC | PRN
  Administered 2016-07-15 – 2016-07-19 (×8): 10 mL via ORAL
  Filled 2016-07-15 (×11): qty 10

## 2016-07-15 MED ORDER — AMLODIPINE BESYLATE 5 MG PO TABS
2.5000 mg | ORAL_TABLET | Freq: Every day | ORAL | Status: DC
  Administered 2016-07-15 – 2016-07-16 (×2): 2.5 mg via ORAL
  Filled 2016-07-15 (×2): qty 1

## 2016-07-15 MED ORDER — DOCUSATE SODIUM 100 MG PO CAPS
100.0000 mg | ORAL_CAPSULE | Freq: Every day | ORAL | Status: DC | PRN
  Administered 2016-07-15: 100 mg via ORAL
  Filled 2016-07-15: qty 1

## 2016-07-15 MED ORDER — SENNA 8.6 MG PO TABS
1.0000 | ORAL_TABLET | Freq: Every day | ORAL | Status: DC | PRN
  Administered 2016-07-15 – 2016-07-16 (×2): 8.6 mg via ORAL
  Filled 2016-07-15 (×2): qty 1

## 2016-07-15 MED ORDER — MAGNESIUM CITRATE PO SOLN: 1.0000 | Freq: Once | ORAL | Status: DC | PRN

## 2016-07-15 NOTE — Progress Notes (Signed)
Family Meeting Note  Advance Directive:yes  Today a meeting took place with the Patient, SON, DAUGHTER  The following clinical team members were present during this meeting:MD  The following were discussed:Patient's diagnosis: , Patient's progosis: > 12 months and Goals for treatment: Full Code,2 Yvetta CoderAUGHTERS [employer] power of attorney Not considering palliative care at this time  Additional follow-up to be provided:hospitalist, cardiology   Time spent during discussion:18 min   Thanvi Blincoe, Deanna ArtisAruna, MD

## 2016-07-15 NOTE — Progress Notes (Signed)
Patient refuses bed alarm. Educated on safety. Agrees to call before getting up. 

## 2016-07-15 NOTE — Progress Notes (Signed)
[facility]  SUBJECTIVE: still with edema in legs but improved. Down 3.9 liters since admission. Weight. Down to 234 from 242.    Vitals:   07/14/16 0731 07/14/16 1158 07/14/16 1929 07/15/16 0513  BP: 132/60 120/68 (!) 117/38 (!) 114/51  Pulse: 75 78 75 73  Resp: 17 18 18 18   Temp: 97.6 F (36.4 C) 97.5 F (36.4 C) 98 F (36.7 C) 97.8 F (36.6 C)  TempSrc: Oral Oral Oral Oral  SpO2: 95% 96% 94% 97%  Weight:    234 lb 9.6 oz (106.4 kg)  Height:        Intake/Output Summary (Last 24 hours) at 07/15/16 0754 Last data filed at 07/15/16 [id number]  Gross per 24 hour  Intake              840 ml  Output             2400 ml  Net            -1560 ml    LABS: Basic Metabolic Panel:  Recent Labs  96/10/5399/02/18 0240 07/15/16 0708  NA 139 140  K 3.5 3.6  CL 101 101  CO2 30 32  GLUCOSE 128* 123*  BUN 22* 21*  CREATININE 0.91 1.09  CALCIUM 9.0 9.0   Liver Function Tests:  Recent Labs  07/13/16 1355  AST 16  ALT 12*  ALKPHOS 96  BILITOT 1.3*  PROT 7.1  ALBUMIN 3.7   No results for input(s): LIPASE, AMYLASE in the last 72 hours. CBC:  Recent Labs  07/13/16 1355 07/14/16 0240 07/15/16 0708  WBC 6.3 4.8 5.0  NEUTROABS 4.6  --   --   HGB 14.1 13.5 13.4  HCT 41.9 40.1 39.4*  MCV 101.9* 101.6* 100.3*  PLT 145* 143* 131*   Cardiac Enzymes:  Recent Labs  07/13/16 1848 07/13/16 2231 07/14/16 0240  TROPONINI <0.03 <0.03 <0.03   BNP: Invalid input(s): POCBNP D-Dimer: No results for input(s): DDIMER in the last 72 hours. Hemoglobin A1C: No results for input(s): HGBA1C in the last 72 hours. Fasting Lipid Panel: No results for input(s): CHOL, HDL, LDLCALC, TRIG, CHOLHDL, LDLDIRECT in the last 72 hours. Thyroid Function Tests: No results for input(s): TSH, T4TOTAL, T3FREE, THYROIDAB in the last 72 hours.  Invalid input(s): FREET3 Anemia Panel: No results for input(s): VITAMINB12, FOLATE, FERRITIN, TIBC, IRON, RETICCTPCT in the  last 72 hours.   Physical Exam: Blood pressure (!) 114/51, pulse 73, temperature 97.8 F (36.6 C), temperature source Oral, resp. rate 18, height 5\' 11"  (1.803 m), weight 234 lb 9.6 oz (106.4 kg), SpO2 97 %.   Wt Readings from Last 1 Encounters:  07/15/16 234 lb 9.6 oz (106.4 kg)     General appearance: alert and cooperative Resp: rhonchi bibasilar Cardio: irregularly irregular rhythm GI: soft, non-tender; bowel sounds normal; no masses,  no organomegaly Extremities: edema 2+ edema in legs Skin: ecchymoses - lower leg(s) bilateral and erythema - lower leg(s) bilateral Neurologic: Grossly normal  TELEMETRY: Reviewed telemetry pt in afib with controlled vr.  ASSESSMENT AND PLAN:  Active Problems:   CHF (congestive heart failure) (HCC)-EF 25% which is similar to what patient was aware of from his physician at home. Has diuresed approximately 4 liters since admission. Attempting to optimize meds. WIll discontinue irbesartan and start Entresto. Decrease amlodipine to 2.5 mg daily. Continue coreg and spironolactone. Continue with lasix at current dose and follow electrolytes and renal function. Will need discussion regarding ICD for primary  prevention also afater medications are optimzed.   afib-apparently is new based on patients report. On eliquis presently. Will continue at current dose and follow for bleeding and renal function. WIll need consideration for cardioversion when adequately anticoagulated and chf exacerbation is fully treated. Rate is currently controlled with carvedilol.   CAD-s/p cabg at [facility]. WIll attempt to obtain surgical records regarding anatomy. Has ruled out for mi thus far and acute ischemia does not appear to be playing a role at present but further ischemic workup will need to be carried out after medications are optimized.     Dalia Heading, MD, [facility] 07/15/2016 7:54 AM

## 2016-07-15 NOTE — Progress Notes (Signed)
[facility] -  at [facility]   PATIENT NAME: Patrick Skinner    MR#:  [id number]  DATE OF BIRTH:  [birth year]  SUBJECTIVE:  CHIEF COMPLAINT:  Patient is visiting his family members here, came from [place] Today he is feeling okay, reporting productive cough. Son and daughter at bedside. Reporting lower extremity redness  REVIEW OF SYSTEMS:  CONSTITUTIONAL: No fever, fatigue or weakness.  EYES: No blurred or double vision.  EARS, NOSE, AND THROAT: No tinnitus or ear pain.  RESPIRATORY: No cough, shortness of breathWhile resting, no wheezing or hemoptysis.  CARDIOVASCULAR: No chest pain, orthopnea, edema.  GASTROINTESTINAL: No nausea, vomiting, diarrhea or abdominal pain.  GENITOURINARY: No dysuria, hematuria.  ENDOCRINE: No polyuria, nocturia,  HEMATOLOGY: No anemia, easy bruising or bleeding SKIN:Is reporting scrotal edema, patient deferred physical examination No rash or lesion. MUSCULOSKELETAL: Reporting significant lower extremity swelling nd noticing redness No joint pain or arthritis.   NEUROLOGIC: No tingling, numbness, weakness.  PSYCHIATRY: No anxiety or depression.   DRUG ALLERGIES:  Not on File  VITALS:  Blood pressure 132/64, pulse 81, temperature 97.9 F (36.6 C), temperature source Oral, resp. rate 18, height 5\' 11"  (1.803 m), weight 106.4 kg (234 lb 9.6 oz), SpO2 98 %.  PHYSICAL EXAMINATION:  GENERAL:  71 y.o.-year-old patient lying in the bed with no acute distress.  EYES: Pupils equal, round, reactive to light and accommodation. No scleral icterus. Extraocular muscles intact.  HEENT: Head atraumatic, normocephalic. Oropharynx and nasopharynx clear.  NECK:  Supple, no jugular venous distention. No thyroid enlargement, no tenderness.  LUNGS: Moderate breath sounds bilaterally, no wheezing, rales,rhonchi or crepitation. No use of accessory muscles of respiration.  CARDIOVASCULAR: Irregularly irregular. No murmurs, rubs, or  gallops.  ABDOMEN: Soft, nontender, nondistended. Bowel sounds present. No organomegaly or mass.  EXTREMITIES: 3+ pedal edema, no cyanosis, or clubbing. Erythematous macular papular rash is noted on bilateral lower extremities extending up to the knees, patchy NEUROLOGIC: Cranial nerves II through XII are intact. Muscle strength 5/5 in all extremities. Sensation intact. Gait not checked.  PSYCHIATRIC: The patient is alert and oriented x 3.  SKIN: No obvious rash, lesion, or ulcer.    LABORATORY PANEL:   CBC  Recent Labs Lab 07/15/16 0708  WBC 5.0  HGB 13.4  HCT 39.4*  PLT 131*   ------------------------------------------------------------------------------------------------------------------  Chemistries   Recent Labs Lab 07/13/16 1355  07/15/16 0708  NA 135  < > 140  K 4.1  < > 3.6  CL 99*  < > 101  CO2 28  < > 32  GLUCOSE 118*  < > 123*  BUN 22*  < > 21*  CREATININE 1.13  < > 1.09  CALCIUM 9.1  < > 9.0  MG  --   --  1.6*  AST 16  --   --   ALT 12*  --   --   ALKPHOS 96  --   --   BILITOT 1.3*  --   --   < > = values in this interval not displayed. ------------------------------------------------------------------------------------------------------------------  Cardiac Enzymes  Recent Labs Lab 07/14/16 0240  TROPONINI <0.03   ------------------------------------------------------------------------------------------------------------------  RADIOLOGY:  US Venous Img Lower Bilateral  Result Date: 07/15/2016 CLINICAL DATA:  Lower extremity swelling. EXAM: BILATERAL LOWER EXTREMITY VENOUS DOPPLER ULTRASOUND TECHNIQUE: Gray-scale sonography with graded compression, as well as color Doppler and duplex ultrasound were performed to evaluate the lower extremity deep venous systems from the level of the common femoral vein and including  the common femoral, femoral, profunda femoral, popliteal and calf veins including the posterior tibial, peroneal and gastrocnemius  veins when visible. The superficial great saphenous vein was also interrogated. Spectral Doppler was utilized to evaluate flow at rest and with distal augmentation maneuvers in the common femoral, femoral and popliteal veins. COMPARISON:  No prior. FINDINGS: RIGHT LOWER EXTREMITY Common Femoral Vein: No evidence of thrombus. Normal compressibility, respiratory phasicity and response to augmentation. Saphenofemoral Junction: No evidence of thrombus. Normal compressibility and flow on color Doppler imaging. Profunda Femoral Vein: No evidence of thrombus. Normal compressibility and flow on color Doppler imaging. Femoral Vein: No evidence of thrombus. Normal compressibility, respiratory phasicity and response to augmentation. Popliteal Vein: No evidence of thrombus. Normal compressibility, respiratory phasicity and response to augmentation. Calf Veins: No evidence of thrombus. Normal compressibility and flow on color Doppler imaging. Superficial Great Saphenous Vein: No evidence of thrombus. Normal compressibility and flow on color Doppler imaging. Venous Reflux:  None. Other Findings:  Soft tissue edema noted over the calf . LEFT LOWER EXTREMITY Common Femoral Vein: No evidence of thrombus. Normal compressibility, respiratory phasicity and response to augmentation. Saphenofemoral Junction: No evidence of thrombus. Normal compressibility and flow on color Doppler imaging. Profunda Femoral Vein: No evidence of thrombus. Normal compressibility and flow on color Doppler imaging. Femoral Vein: No evidence of thrombus. Normal compressibility, respiratory phasicity and response to augmentation. Popliteal Vein: No evidence of thrombus. Normal compressibility, respiratory phasicity and response to augmentation. Calf Veins: No evidence of thrombus. Normal compressibility and flow on color Doppler imaging. Superficial Great Saphenous Vein: No evidence of thrombus. Normal compressibility and flow on color Doppler imaging. Venous  Reflux:  None. Other Findings:  Soft tissue edema noted over the calf. IMPRESSION: No evidence of deep venous thrombosis. Electronically Signed   By: Maisie Fushomas  Register   On: 07/15/2016 16:23    EKG:   Orders placed or performed during the hospital encounter of 07/13/16  . ED EKG  . ED EKG  . EKG 12-Lead  . EKG 12-Lead    ASSESSMENT AND PLAN:   71 year old male from [place] with past medical history of obesity, diabetes, hypertension, coronary artery disease status post bypass, history of previous MI, CHF who presents to the hospital due to shortness of breath, lower extremity edema.  1. Acute systolic CHF exacerbation with anasarca and scrotal edema. -clinically improving,net output 4712 mL since admission -Echocardiogram with left ventricular ejection fraction 25-30% -We'll aggressively diurese him with IV Lasix and follow I's and O's and daily weights. -Continue Coreg, irbesartan. -Amlodipine dose reduced to 5 mg -Spironolactone 25 mg is added to the regimen -[facility]  -considering ICD placement after patient is medically stabilized  2. Atrial fibrillation-patient has had a previous history of atrial fibrillation but was cardioverted years ago when he had his bypass.  -He is rate controlled. -continue carvedilol.  - started him on anticoagulation with Eliquis 5 mg by mouth twice a day  Cardiology is agreeable   3. Bilateral lower extending to cellulitis Patient is started on IV Rocephin DVT ruled out with negative venous Dopplers Encouraged patient to elevate his legs by 2 pillows  3. 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  4. Essential hypertension-continue carvedilol, Norvasc dose reduced to 5 mg, irbesartan.  5. Diabetic neuropathy-continue gabapentin.  6. Hyperlipidemia-continue simvastatin.     All the records are reviewed and case discussed with Care Management/Social  Workerr. Management plans discussed with  the patient, family and they are in agreement.  CODE STATUS: fc  TOTAL TIME TAKING CARE OF THIS PATIENT: 36  minutes.   POSSIBLE D/C IN 2  DAYS, DEPENDING ON CLINICAL CONDITION.  Note: This dictation was prepared with Dragon dictation along with smaller phrase technology. Any transcriptional errors that result from this process are unintentional.   Ramonita Lab M.D on 07/15/2016 at 4:44 PM  Between 7am to 6pm - Pager - [phone] After 6pm go to [email or web address] - password [email or web address] [facility]  [place] [facility]  Office  [phone]  CC: Primary care physician; No PCP Per Patient

## 2016-07-16 ENCOUNTER — Inpatient Hospital Stay: Payer: Medicare Other

## 2016-07-16 LAB — BASIC METABOLIC PANEL
Anion gap: 7 (ref 5–15)
BUN: 21 mg/dL — AB (ref 6–20)
CALCIUM: 9.1 mg/dL (ref 8.9–10.3)
CO2: 36 mmol/L — ABNORMAL HIGH (ref 22–32)
Chloride: 95 mmol/L — ABNORMAL LOW (ref 101–111)
Creatinine, Ser: 1.15 mg/dL (ref 0.61–1.24)
GFR calc Af Amer: >60 mL/min (ref >60)
GLUCOSE: 125 mg/dL — AB (ref 65–99)
Potassium: 3.3 mmol/L — ABNORMAL LOW (ref 3.5–5.1)
SODIUM: 138 mmol/L (ref 135–145)

## 2016-07-16 LAB — CBC
HCT: 43.9 % (ref 40.0–52.0)
Hemoglobin: 14.6 g/dL (ref 13.0–18.0)
MCH: 34.2 pg — ABNORMAL HIGH (ref 26.0–34.0)
MCHC: 33.2 g/dL (ref 32.0–36.0)
MCV: 102.9 fL — ABNORMAL HIGH (ref 80.0–100.0)
PLATELETS: 160 10*3/uL (ref 150–440)
RBC: 4.26 MIL/uL — ABNORMAL LOW (ref 4.40–5.90)
RDW: 13.9 % (ref 11.5–14.5)
WBC: 5 10*3/uL (ref 3.8–10.6)

## 2016-07-16 LAB — GLUCOSE, CAPILLARY
GLUCOSE-CAPILLARY: 142 mg/dL — AB (ref 65–99)
GLUCOSE-CAPILLARY: 172 mg/dL — AB (ref 65–99)
Glucose-Capillary: 112 mg/dL — ABNORMAL HIGH (ref 65–99)
Glucose-Capillary: 178 mg/dL — ABNORMAL HIGH (ref 65–99)

## 2016-07-16 LAB — MAGNESIUM: Magnesium: 1.9 mg/dL (ref 1.7–2.4)

## 2016-07-16 MED ORDER — DEXTROSE 5 % IV SOLN: 1.0000 g | INTRAVENOUS | Status: DC

## 2016-07-16 MED ORDER — FUROSEMIDE 10 MG/ML IJ SOLN: 40.0000 mg | Freq: Every day | INTRAMUSCULAR | Status: DC

## 2016-07-16 MED ORDER — FUROSEMIDE 40 MG PO TABS
40.0000 mg | ORAL_TABLET | Freq: Every day | ORAL | Status: DC
Start: 2016-07-16 — End: 2016-07-20
  Administered 2016-07-16 – 2016-07-20 (×5): 40 mg via ORAL
  Filled 2016-07-16 (×5): qty 1

## 2016-07-16 MED ORDER — CEFTRIAXONE SODIUM-DEXTROSE 1-3.74 GM-% IV SOLR
1.0000 g | INTRAVENOUS | Status: DC
  Administered 2016-07-16 – 2016-07-20 (×5): 1 g via INTRAVENOUS
  Filled 2016-07-16 (×6): qty 50

## 2016-07-16 NOTE — Progress Notes (Addendum)
[facility]  SUBJECTIVE: I feel a little better. Has pleuritic chest pain. Patient has diuresed 2.15 L in the last 24 hours, -6.4 L since admission.   Vitals:   07/15/16 1516 07/15/16 1646 07/15/16 2004 07/16/16 0418  BP: 132/64 124/65 113/65 123/65  Pulse: 81 86 92 84  Resp:   18   Temp:   98.3 F (36.8 C) 97.5 F (36.4 C)  TempSrc:   Oral Oral  SpO2:   96% 97%  Weight:    233 lb (105.7 kg)  Height:        Intake/Output Summary (Last 24 hours) at 07/16/16 0852 Last data filed at 07/16/16 0752  Gross per 24 hour  Intake              600 ml  Output             2750 ml  Net            -2150 ml    LABS: Basic Metabolic Panel:  Recent Labs  40/98/11 0708 07/16/16 0425  NA 140 138  K 3.6 3.3*  CL 101 95*  CO2 32 36*  GLUCOSE 123* 125*  BUN 21* 21*  CREATININE 1.09 1.15  CALCIUM 9.0 9.1  MG 1.6* 1.9   Liver Function Tests:  Recent Labs  07/13/16 1355  AST 16  ALT 12*  ALKPHOS 96  BILITOT 1.3*  PROT 7.1  ALBUMIN 3.7   No results for input(s): LIPASE, AMYLASE in the last 72 hours. CBC:  Recent Labs  07/13/16 1355  07/15/16 0708 07/16/16 0425  WBC 6.3  < > 5.0 5.0  NEUTROABS 4.6  --   --   --   HGB 14.1  < > 13.4 14.6  HCT 41.9  < > 39.4* 43.9  MCV 101.9*  < > 100.3* 102.9*  PLT 145*  < > 131* 160  < > = values in this interval not displayed. Cardiac Enzymes:  Recent Labs  07/13/16 1848 07/13/16 2231 07/14/16 0240  TROPONINI <0.03 <0.03 <0.03   BNP: Invalid input(s): POCBNP D-Dimer: No results for input(s): DDIMER in the last 72 hours. Hemoglobin A1C: No results for input(s): HGBA1C in the last 72 hours. Fasting Lipid Panel: No results for input(s): CHOL, HDL, LDLCALC, TRIG, CHOLHDL, LDLDIRECT in the last 72 hours. Thyroid Function Tests: No results for input(s): TSH, T4TOTAL, T3FREE, THYROIDAB in the last 72 hours.  Invalid input(s): FREET3 Anemia Panel: No results for input(s): VITAMINB12,  FOLATE, FERRITIN, TIBC, IRON, RETICCTPCT in the last 72 hours.   Physical Exam: Blood pressure 123/65, pulse 84, temperature 97.5 F (36.4 C), temperature source Oral, resp. rate 18, height 5\' 11"  (1.803 m), weight 233 lb (105.7 kg), SpO2 97 %.   Wt Readings from Last 1 Encounters:  07/16/16 233 lb (105.7 kg)     General appearance: alert and cooperative Resp: rhonchi bilaterally Cardio: irregularly irregular rhythm GI: soft, non-tender; bowel sounds normal; no masses,  no organomegaly Extremities: edema 1+ edema in lower extremities bilaterally Skin: Bilateral lower extremity erythema. Neurologic: Grossly normal  TELEMETRY: Reviewed telemetry pt in atrial fibrillation with controlled ventricular response:  ASSESSMENT AND PLAN:  Active Problems:   CHF (congestive heart failure) (HCC)-has diuresed 6-1/2 L. Creatinine is slightly elevated indicating probable dry weight. Will convert IV Lasix to 40 mg by mouth daily. Will continue with Entresto, spironolactone. Serum potassium is 3.3. Will follow on Entresto, spironolactone and low-dose potassium supplementation. We'll proceed with a chest  x-ray today to evaluate for improvement as well as for his pleuritic chest pain.  Atrial fibrillation-rate is controlled. On Eliquis. Will defer cardioversion until CHF optimized as well as adequate anticoagulation.  Cellulitis-oral with IV Rocephin 1 g every 24 hours; no evidence of DVT with Lower extremity ultrasound.    Dalia HeadingKenneth A Robertlee Rogacki, MD, [facility] 07/16/2016 8:52 AM

## 2016-07-16 NOTE — Progress Notes (Signed)
[facility] [place] at [facility]   PATIENT NAME: Patrick DuckingJohnny Skinner    MR#:  [id number]  DATE OF BIRTH:  04/09/1946  SUBJECTIVE:  CHIEF COMPLAINT:  Patient is visiting his family members here, came from [place] Today he is feeling okay, reporting productive cough.Still has redness on the lower extremities which is worse today  REVIEW OF SYSTEMS:  CONSTITUTIONAL: No fever, fatigue or weakness.  EYES: No blurred or double vision.  EARS, NOSE, AND THROAT: No tinnitus or ear pain.  RESPIRATORY: No cough, shortness of breathWhile resting, no wheezing or hemoptysis.  CARDIOVASCULAR: No chest pain, orthopnea, edema.  GASTROINTESTINAL: No nausea, vomiting, diarrhea or abdominal pain.  GENITOURINARY: No dysuria, hematuria.  ENDOCRINE: No polyuria, nocturia,  HEMATOLOGY: No anemia, easy bruising or bleeding SKIN:Is reporting scrotal edema is improving No rash or lesion. MUSCULOSKELETAL: Reporting significant lower extremity swelling nd noticing redness No joint pain or arthritis.   NEUROLOGIC: No tingling, numbness, weakness.  PSYCHIATRY: No anxiety or depression.   DRUG ALLERGIES:  Not on File  VITALS:  Blood pressure 130/62, pulse 70, temperature 97.4 F (36.3 C), temperature source Oral, resp. rate 18, height 5\' 11"  (1.803 m), weight 105.7 kg (233 lb), SpO2 99 %.  PHYSICAL EXAMINATION:  GENERAL:  71 y.o.-year-old patient lying in the bed with no acute distress.  EYES: Pupils equal, round, reactive to light and accommodation. No scleral icterus. Extraocular muscles intact.  HEENT: Head atraumatic, normocephalic. Oropharynx and nasopharynx clear.  NECK:  Supple, no jugular venous distention. No thyroid enlargement, no tenderness.  LUNGS: Moderate breath sounds bilaterally, no wheezing, rales,rhonchi or crepitation. No use of accessory muscles of respiration.  CARDIOVASCULAR: Irregularly irregular. No murmurs, rubs, or gallops.  ABDOMEN: Soft, nontender,  nondistended. Bowel sounds present. No organomegaly or mass.  EXTREMITIES: 3+ pedal edema, no cyanosis, or clubbing. Erythematous macular papular rash is noted on bilateral lower extremities extending up to the knees, confluent NEUROLOGIC: Cranial nerves II through XII are intact. Muscle strength 5/5 in all extremities. Sensation intact. Gait not checked.  PSYCHIATRIC: The patient is alert and oriented x 3.  SKIN: No obvious rash, lesion, or ulcer.    LABORATORY PANEL:   CBC  Recent Labs Lab 07/16/16 0425  WBC 5.0  HGB 14.6  HCT 43.9  PLT 160   ------------------------------------------------------------------------------------------------------------------  Chemistries   Recent Labs Lab 07/13/16 1355  07/16/16 0425  NA 135  < > 138  K 4.1  < > 3.3*  CL 99*  < > 95*  CO2 28  < > 36*  GLUCOSE 118*  < > 125*  BUN 22*  < > 21*  CREATININE 1.13  < > 1.15  CALCIUM 9.1  < > 9.1  MG  --   < > 1.9  AST 16  --   --   ALT 12*  --   --   ALKPHOS 96  --   --   BILITOT 1.3*  --   --   < > = values in this interval not displayed. ------------------------------------------------------------------------------------------------------------------  Cardiac Enzymes  Recent Labs Lab 07/14/16 0240  TROPONINI <0.03   ------------------------------------------------------------------------------------------------------------------  RADIOLOGY:  Dg Chest 2 View  Result Date: 07/16/2016 CLINICAL DATA:  CHF. History of previous MI, diabetes, current smoker. Previous CABG. EXAM: CHEST  2 VIEW COMPARISON:  PA and lateral chest x-ray of July 13, 2016 FINDINGS: The lungs are mildly hyperinflated. The interstitial markings are slightly less conspicuous today. There remain small bilateral pleural effusions but the  effusion on the left has decreased in volume. The cardiac silhouette remains mildly enlarged. The pulmonary vascularity is less prominent. There is calcification in the wall of the  aortic arch. The sternal wires are intact with exception of the lower most wire. This is chronic. The bony thorax exhibits no acute abnormality. IMPRESSION: COPD. Mild improvement in pulmonary interstitial edema. Slight decrease in the volume of the left pleural effusion. Stable small right pleural effusion. Thoracic aortic atherosclerosis. Electronically Signed   By: David  [place] M.D.   On: 07/16/2016 09:37   US Venous Img Lower Bilateral  Result Date: 07/15/2016 CLINICAL DATA:  Lower extremity swelling. EXAM: BILATERAL LOWER EXTREMITY VENOUS DOPPLER ULTRASOUND TECHNIQUE: Gray-scale sonography with graded compression, as well as color Doppler and duplex ultrasound were performed to evaluate the lower extremity deep venous systems from the level of the common femoral vein and including the common femoral, femoral, profunda femoral, popliteal and calf veins including the posterior tibial, peroneal and gastrocnemius veins when visible. The superficial great saphenous vein was also interrogated. Spectral Doppler was utilized to evaluate flow at rest and with distal augmentation maneuvers in the common femoral, femoral and popliteal veins. COMPARISON:  No prior. FINDINGS: RIGHT LOWER EXTREMITY Common Femoral Vein: No evidence of thrombus. Normal compressibility, respiratory phasicity and response to augmentation. Saphenofemoral Junction: No evidence of thrombus. Normal compressibility and flow on color Doppler imaging. Profunda Femoral Vein: No evidence of thrombus. Normal compressibility and flow on color Doppler imaging. Femoral Vein: No evidence of thrombus. Normal compressibility, respiratory phasicity and response to augmentation. Popliteal Vein: No evidence of thrombus. Normal compressibility, respiratory phasicity and response to augmentation. Calf Veins: No evidence of thrombus. Normal compressibility and flow on color Doppler imaging. Superficial Great Saphenous Vein: No evidence of thrombus. Normal  compressibility and flow on color Doppler imaging. Venous Reflux:  None. Other Findings:  Soft tissue edema noted over the calf . LEFT LOWER EXTREMITY Common Femoral Vein: No evidence of thrombus. Normal compressibility, respiratory phasicity and response to augmentation. Saphenofemoral Junction: No evidence of thrombus. Normal compressibility and flow on color Doppler imaging. Profunda Femoral Vein: No evidence of thrombus. Normal compressibility and flow on color Doppler imaging. Femoral Vein: No evidence of thrombus. Normal compressibility, respiratory phasicity and response to augmentation. Popliteal Vein: No evidence of thrombus. Normal compressibility, respiratory phasicity and response to augmentation. Calf Veins: No evidence of thrombus. Normal compressibility and flow on color Doppler imaging. Superficial Great Saphenous Vein: No evidence of thrombus. Normal compressibility and flow on color Doppler imaging. Venous Reflux:  None. Other Findings:  Soft tissue edema noted over the calf. IMPRESSION: No evidence of deep venous thrombosis. Electronically Signed   By: Maisie Fus  Register   On: 07/15/2016 16:23    EKG:   Orders placed or performed during the hospital encounter of 07/13/16  . ED EKG  . ED EKG  . EKG 12-Lead  . EKG 12-Lead    ASSESSMENT AND PLAN:   71 year old male from [place] with past medical history of obesity, diabetes, hypertension, coronary artery disease status post bypass, history of previous MI, CHF who presents to the hospital due to shortness of breath, lower extremity edema.  1. Acute systolic CHF exacerbation with anasarca and scrotal edema. -clinically improving,net output 6757 mL since admission -Echocardiogram with left ventricular ejection fraction 25-30% -aggressively diuresed him with IV Lasix and Change to PO lasix -Continue Coreg, irbesartan. -Amlodipine dose reduced to 5 mg -Spironolactone 25 mg is added to the regimen -Follow up with [facility]  cardiology   -considering ICD placement after patient is medically stabilized  2. Atrial fibrillation-patient has had a previous history of atrial fibrillation but was cardioverted years ago when he had his bypass.  -He is rate controlled. -continue carvedilol.  - started him on anticoagulation with Eliquis 5 mg by mouth twice a day  Cardiology is agreeable   3. Bilateral lower extending to cellulitis Patient is started on IV Rocephin DVT ruled out with negative venous Dopplers Encouraged patient to elevate his legs by 2 pillows Norco for pain management  3. 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  4. Essential hypertension-continue carvedilol, Norvasc dose reduced to 5 mg, irbesartan.  5. Diabetic neuropathy-continue gabapentin.  6. Hyperlipidemia-continue simvastatin.   PT consult   All the records are reviewed and case discussed with Care Management/Social Workerr. Management plans discussed with the patient, family and they are in agreement.  CODE STATUS: fc  TOTAL TIME TAKING CARE OF THIS PATIENT: 36  minutes.   POSSIBLE D/C IN 2  DAYS, DEPENDING ON CLINICAL CONDITION.  Note: This dictation was prepared with Dragon dictation along with smaller phrase technology. Any transcriptional errors that result from this process are unintentional.   Ramonita Lab M.D on 07/16/2016 at 3:13 PM  Between 7am to 6pm - Pager - [phone] After 6pm go to [email or web address] - password [email or web address] [facility]  [place] [facility]  Office  [phone]  CC: Primary care physician; No PCP Per Patient

## 2016-07-17 LAB — GLUCOSE, CAPILLARY
GLUCOSE-CAPILLARY: 180 mg/dL — AB (ref 65–99)
GLUCOSE-CAPILLARY: 176 mg/dL — AB (ref 65–99)
Glucose-Capillary: 141 mg/dL — ABNORMAL HIGH (ref 65–99)
Glucose-Capillary: 126 mg/dL — ABNORMAL HIGH (ref 65–99)

## 2016-07-17 LAB — BASIC METABOLIC PANEL
ANION GAP: 7 (ref 5–15)
BUN: 24 mg/dL — ABNORMAL HIGH (ref 6–20)
CO2: 34 mmol/L — AB (ref 22–32)
Calcium: 9.1 mg/dL (ref 8.9–10.3)
Chloride: 98 mmol/L — ABNORMAL LOW (ref 101–111)
Creatinine, Ser: 1.13 mg/dL (ref 0.61–1.24)
GLUCOSE: 145 mg/dL — AB (ref 65–99)
POTASSIUM: 3.7 mmol/L (ref 3.5–5.1)
Sodium: 139 mmol/L (ref 135–145)

## 2016-07-17 LAB — CBC
HEMATOCRIT: 38.9 % — AB (ref 40.0–52.0)
HEMOGLOBIN: 13.1 g/dL (ref 13.0–18.0)
MCH: 34.5 pg — ABNORMAL HIGH (ref 26.0–34.0)
MCHC: 33.7 g/dL (ref 32.0–36.0)
MCV: 102.2 fL — ABNORMAL HIGH (ref 80.0–100.0)
Platelets: 139 10*3/uL — ABNORMAL LOW (ref 150–440)
RBC: 3.8 MIL/uL — AB (ref 4.40–5.90)
RDW: 13.8 % (ref 11.5–14.5)
WBC: 4.7 10*3/uL (ref 3.8–10.6)

## 2016-07-17 NOTE — Progress Notes (Signed)
ANTICOAGULATION CONSULT NOTE - Initial Consult  Pharmacy Consult for apixaban Indication: atrial fibrillation  Not on File  Patient Measurements: Height: 5\' 11"  (180.3 cm) Weight: 232 lb 14.4 oz (105.6 kg) IBW/kg (Calculated) : 75.3  Vital Signs: Temp: 97.4 F (36.3 C) (01/05 1058) Temp Source: Oral (01/05 1058) BP: 119/65 (01/05 1058) Pulse Rate: 86 (01/05 1058)  Labs:  Recent Labs  07/15/16 0708 07/16/16 0425 07/17/16 0426  HGB 13.4 14.6 13.1  HCT 39.4* 43.9 38.9*  PLT 131* 160 139*  CREATININE 1.09 1.15 1.13    Estimated Creatinine Clearance: 75.2 mL/min (by C-G formula based on SCr of 1.13 mg/dL).   Medical History: Past Medical History:  Diagnosis Date  . Aortic aneurysm (HCC)   . CHF (congestive heart failure) (HCC)   . Diabetes mellitus without complication (HCC)   . MI (myocardial infarction)   . S/P triple vessel bypass     Assessment: 71yo male with nonvalvular atrial fibrillation. Not previously on anticoagulation.   Goal of Therapy:  Monitor platelets by anticoagulation protocol: Yes   Plan:  Continue apixaban 5mg  PO BID.  Counseled by pharmacy on 1/3.  Cindi CarbonMary M Adelayde Minney, PharmD, BCPS Clinical Pharmacist 07/17/2016,11:57 AM

## 2016-07-17 NOTE — Evaluation (Signed)
Physical Therapy Evaluation Patient Details Name: Patrick Skinner MRN: [id number] DOB: 07/10/1946 Today's Date: 07/17/2016   History of Present Illness  Patrick Skinner  is a 71 y.o. male with a known history of CHF, coronary artery disease status post bypass, history of previous MI, diabetes, history of aortic aneurysm repair who presents to the hospital due to shortness of breath, worsening lower extremity edema. Patient is visiting his family from [place] and is normally not short of breath. Shortness of breath now even on minimal exertion. He has had about a 10-15 pound weight gain over the past 2 weeks. He also admits to a cough which is productive with clear sputum, some mild paroxysmal nocturnal dyspnea and a baseline two-pillow orthopnea. He denies any chest pain, nausea, vomiting, palpitations or any other associated symptoms. Patient presented to the emergency room was noted to be in congestive heart failure and volume overloaded and hospitalist services were contacted further treatment and evaluation  Clinical Impression  Pt admitted with above diagnosis. Pt currently with functional limitations due to the deficits listed below (see PT Problem List). Pt demonstrates deconditioning and balance impairment during evaluation. He is able to ambulate functional household distances but with decreased speed. Denies DOE and VSS throughout session. Pt reaches for walls/counters during ambulation secondary to instability. Positive Rhomberg for increased sway and single leg balance is only 1-2 seconds on each leg. [facility] but currently refusing. He would also benefit from use of rolling walker to decrease fall risk however he is also refusing but states he will think about it. Please issue a rolling walker if patient changes his mind and is in agreement. Pt will benefit from skilled PT services to address deficits in strength, balance, and mobility in order to  return to full function at home.     Follow Up Recommendations Home health PT;Other (comment) (Pt currently refusing HH PT or OP PT)    Equipment Recommendations  Rolling walker with 5" wheels;Other (comment) (Pt unsure if he wants a rolling walker, will update)    Recommendations for Other Services       Precautions / Restrictions Precautions Precautions: Fall Restrictions Weight Bearing Restrictions: No      Mobility  Bed Mobility               General bed mobility comments: Received sitting upright at EOB and left sitting upright at EOB eating breakfasst  Transfers Overall transfer level: Needs assistance Equipment used: Rolling walker (2 wheeled) Transfers: Sit to/from Stand Sit to Stand: Min guard         General transfer comment: Pt demonstrates fair balance with sit to stand transfers without UE support. Appropriate safe use of hands to push up from sitting surface  Ambulation/Gait Ambulation/Gait assistance: Min guard Ambulation Distance (Feet): 150 Feet Assistive device: None Gait Pattern/deviations: Decreased step length - right;Decreased step length - left Gait velocity: Functional for household and limited community mobility Gait velocity interpretation: <1.8 ft/sec, indicative of risk for recurrent falls General Gait Details: Pt ambulates with decreased step length and speed. During ambulation he reaches out to hold onto walls and railings intermittently. Balance improves as ambulation distance increases. Able to perform horizontal and vertical head turns but with slowing of gait. Unable to demonstrate notable change in gait speed with cues. Vitals signs stable throughout ambulation with SaO2>90% on room air throughout  [employer]            Wheelchair  Mobility    Modified Rankin (Stroke Patients Only)       Balance Overall balance assessment: Needs assistance Sitting-balance support: No upper extremity supported Sitting balance-Leahy Scale:  Good     Standing balance support: No upper extremity supported Standing balance-Leahy Scale: Fair Standing balance comment: Able to maintain feet apart and together balance. Positive Rhomberg for increased sway. Single leg balance 1-2 seconds on each LE                             Pertinent Vitals/Pain Pain Assessment: 0-10 Pain Score: 4  Pain Location: R lower back pain Pain Descriptors / Indicators: Aching Pain Intervention(s): Monitored during session;Premedicated before session    Home Living Family/patient expects to be discharged to:: Private residence Living Arrangements: Other (Comment) ("Friend," unclear if this is romantic or Psychologist, prison and probation services) Available Help at Discharge: Family Type of Home: House Home Access: Stairs to enter Entrance Stairs-Rails: Can reach both Entrance Stairs-Number of Steps: 3 Home Layout: One level Home Equipment: Walker - standard;Shower seat;Grab bars - tub/shower;Hospital bed (no wheelchair, no rolling walker)      Prior Function Level of Independence: Independent         Comments: Pt reports independence with ADLs/IADLs. Ambulates limited community distances without assistive devices but limited secondary to DOE. Drives. Denies falls in the last 12 months     Hand Dominance   Dominant Hand: Right    Extremity/Trunk Assessment   Upper Extremity Assessment Upper Extremity Assessment: Overall WFL for tasks assessed    Lower Extremity Assessment Lower Extremity Assessment: Generalized weakness;RLE deficits/detail RLE Deficits / Details: Bilateral hip flexion and ankle DF 4-/5. Knee flexion/extension 4+/5. Pt reports history of chronic diabetic neuropathy. Denies history of foot drop but demonstrates bilateral ankle DF weakness       Communication   Communication: No difficulties  Cognition Arousal/Alertness: Awake/alert Behavior During Therapy: Flat affect Overall Cognitive Status: Within Functional Limits for tasks  assessed                      General Comments      Exercises     Assessment/Plan    PT Assessment Patient needs continued PT services  PT Problem List Decreased strength;Decreased activity tolerance;Decreased balance;Decreased mobility;Decreased safety awareness;Impaired sensation          PT Treatment Interventions DME instruction;Gait training;Stair training;Therapeutic activities;Therapeutic exercise;Balance training;Neuromuscular re-education;Patient/family education    PT Goals (Current goals can be found in the Care Plan section)  Acute Rehab PT Goals Patient Stated Goal: Return to prior level of function and strength at home PT Goal Formulation: With patient Time For Goal Achievement: 07/31/16 Potential to Achieve Goals: Good    Frequency Min 2X/week   Barriers to discharge        Co-evaluation               End of Session Equipment Utilized During Treatment: Gait belt Activity Tolerance: Patient tolerated treatment well Patient left: in bed;with call bell/phone within reach;Other (comment);with family/visitor present (Upright at EOB eating breakfast, moderate fall risk) Nurse Communication: Mobility status         Time: 1308-6578 PT Time Calculation (min) (ACUTE ONLY): 19 min   Charges:   PT Evaluation $PT Eval Low Complexity: 1 Procedure     PT G Codes:       Sharalyn Ink Adriel Desrosier PT, DPT   Slayde Brault 07/17/2016, 10:13 AM

## 2016-07-17 NOTE — Progress Notes (Signed)
[facility] [place] [facility]   PATIENT NAME: Patrick DuckingJohnny Skinner    MR#:  [id number]  DATE OF BIRTH:  07/13/1945  SUBJECTIVE:  CHIEF COMPLAINT:  Patient is visiting his family members here, came from [place]  Still has redness on the lower extremities but swelling is improving  REVIEW OF SYSTEMS:  CONSTITUTIONAL: No fever, fatigue or weakness.  EYES: No blurred or double vision.  EARS, NOSE, AND THROAT: No tinnitus or ear pain.  RESPIRATORY: No cough, shortness of breathWhile resting, no wheezing or hemoptysis.  CARDIOVASCULAR: No chest pain, orthopnea, edema.  GASTROINTESTINAL: No nausea, vomiting, diarrhea or abdominal pain.  GENITOURINARY: No dysuria, hematuria.  ENDOCRINE: No polyuria, nocturia,  HEMATOLOGY: No anemia, easy bruising or bleeding SKIN:Is reporting scrotal edema is improving No rash or lesion. MUSCULOSKELETAL: Reporting significant lower extremity swelling nd noticing redness No joint pain or arthritis.   NEUROLOGIC: No tingling, numbness, weakness.  PSYCHIATRY: No anxiety or depression.   DRUG ALLERGIES:  Not on File  VITALS:  Blood pressure 119/65, pulse 86, temperature 97.4 F (36.3 C), temperature source Oral, resp. rate 18, height 5\' 11"  (1.803 m), weight 105.6 kg (232 lb 14.4 oz), SpO2 97 %.  PHYSICAL EXAMINATION:  GENERAL:  71 y.o.-year-old patient lying in the bed with no acute distress.  EYES: Pupils equal, round, reactive to light and accommodation. No scleral icterus. Extraocular muscles intact.  HEENT: Head atraumatic, normocephalic. Oropharynx and nasopharynx clear.  NECK:  Supple, no jugular venous distention. No thyroid enlargement, no tenderness.  LUNGS: Moderate breath sounds bilaterally, no wheezing, rales,rhonchi or crepitation. No use of accessory muscles of respiration.  CARDIOVASCULAR: Irregularly irregular. No murmurs, rubs, or gallops.  ABDOMEN: Soft, nontender, nondistended. Bowel sounds present. No  organomegaly or mass.  EXTREMITIES: 3+ pedal edema, no cyanosis, or clubbing. Erythematous macular papular rash is noted on bilateral lower extremities extending up to the knees, confluent NEUROLOGIC: Cranial nerves II through XII are intact. Muscle strength 5/5 in all extremities. Sensation intact. Gait not checked.  PSYCHIATRIC: The patient is alert and oriented x 3.  SKIN: No obvious rash, lesion, or ulcer.    LABORATORY PANEL:   CBC  Recent Labs Lab 07/17/16 0426  WBC 4.7  HGB 13.1  HCT 38.9*  PLT 139*   ------------------------------------------------------------------------------------------------------------------  Chemistries   Recent Labs Lab 07/13/16 1355  07/16/16 0425 07/17/16 0426  NA 135  < > 138 139  K 4.1  < > 3.3* 3.7  CL 99*  < > 95* 98*  CO2 28  < > 36* 34*  GLUCOSE 118*  < > 125* 145*  BUN 22*  < > 21* 24*  CREATININE 1.13  < > 1.15 1.13  CALCIUM 9.1  < > 9.1 9.1  MG  --   < > 1.9  --   AST 16  --   --   --   ALT 12*  --   --   --   ALKPHOS 96  --   --   --   BILITOT 1.3*  --   --   --   < > = values in this interval not displayed. ------------------------------------------------------------------------------------------------------------------  Cardiac Enzymes  Recent Labs Lab 07/14/16 0240  TROPONINI <0.03   ------------------------------------------------------------------------------------------------------------------  RADIOLOGY:  Dg Chest 2 View  Result Date: 07/16/2016 CLINICAL DATA:  CHF. History of previous MI, diabetes, current smoker. Previous CABG. EXAM: CHEST  2 VIEW COMPARISON:  PA and lateral chest x-ray of July 13, 2016 FINDINGS:  The lungs are mildly hyperinflated. The interstitial markings are slightly less conspicuous today. There remain small bilateral pleural effusions but the effusion on the left has decreased in volume. The cardiac silhouette remains mildly enlarged. The pulmonary vascularity is less prominent. There  is calcification in the wall of the aortic arch. The sternal wires are intact with exception of the lower most wire. This is chronic. The bony thorax exhibits no acute abnormality. IMPRESSION: COPD. Mild improvement in pulmonary interstitial edema. Slight decrease in the volume of the left pleural effusion. Stable small right pleural effusion. Thoracic aortic atherosclerosis. Electronically Signed   By: David  [place] M.D.   On: 07/16/2016 09:37    EKG:   Orders placed or performed during the hospital encounter of 07/13/16  . ED EKG  . ED EKG  . EKG 12-Lead  . EKG 12-Lead    ASSESSMENT AND PLAN:   71 year old male from [place] with past medical history of obesity, diabetes, hypertension, coronary artery disease status post bypass, history of previous MI, CHF who presents to the hospital due to shortness of breath, lower extremity edema.  1. Acute systolic CHF exacerbation with anasarca and scrotal edema. -clinically improving,net output 7392 mL since admission -Echocardiogram with left ventricular ejection fraction 25-30% -aggressively diuresed him with IV Lasix and Change to PO lasix 40 mg daily -Continue Coreg, irbesartan. -Amlodipine dose reduced to 5 mg eventually discontinued -Spironolactone 25 mg is added to the regimen -Follow up with [facility] cardiology  -considering ICD placement after patient is medically stabilized,OP  2. Atrial fibrillation-patient has had a previous history of atrial fibrillation but was cardioverted years ago when he had his bypass.  -He is rate controlled. -continue carvedilol.  - started him on anticoagulation with Eliquis 5 mg by mouth twice a day  Cardiology is agreeable   3. Bilateral lower extending to cellulitis Patient is started on IV Rocephin, continue the same DVT ruled out with negative venous Dopplers Encouraged patient to elevate his legs by 2 pillows Norco for pain management  3. 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  4. Essential hypertension-continue carvedilol, Norvasc dose reduced to 5 mg, irbesartan.  5. Diabetic neuropathy-continue gabapentin.  6. Hyperlipidemia-continue simvastatin.   PT consult -recommending home health PT, rolling walker with 5 inch wheels  All the records are reviewed and case discussed with Care Management/Social Workerr. Management plans discussed with the patient, family and they are in agreement.  CODE STATUS: fc  TOTAL TIME TAKING CARE OF THIS PATIENT: 36  minutes.   POSSIBLE D/C IN 1-2  DAYS, DEPENDING ON CLINICAL CONDITION.  Note: This dictation was prepared with Dragon dictation along with smaller phrase technology. Any transcriptional errors that result from this process are unintentional.   Ramonita Lab M.D on 07/17/2016 at 5:32 PM  Between 7am to 6pm - Pager - [phone] After 6pm go to [email or web address] - password [email or web address] [facility]  [place]  [facility]  Office  [phone]  CC: Primary care physician; No PCP Per Patient

## 2016-07-17 NOTE — Progress Notes (Signed)
Patient had episode of choking on own saliva but is now fine. Patient states that this happens often to him at home and says that it can be when he's eating or even when he's not eating. MD notified and speech consult ordered as well as patient made NPO. Will continue to monitor. Patient currently in bed with no acute distress.

## 2016-07-17 NOTE — Care Management (Signed)
Independent in all adls, denies issues accessing medical care, obtaining medications or with transportation.   Patient is visiting [place] from [place].  Anticipates return to [place] soon after discharge.  He is currently not requiring supplemental 02. Continues with IV antibiotics for cellulitis of lower extremities.  Declining home health services

## 2016-07-17 NOTE — Evaluation (Signed)
Clinical/Bedside Swallow Evaluation Patient Details  Name: Patrick Skinner MRN: [id number] Date of Birth: 03/24/1946  Today's Date: 07/17/2016 Time: SLP Start Time (ACUTE ONLY): 1300 SLP Stop Time (ACUTE ONLY): 1400 SLP Time Calculation (min) (ACUTE ONLY): 60 min  Past Medical History:  Past Medical History:  Diagnosis Date  . Aortic aneurysm (HCC)   . CHF (congestive heart failure) (HCC)   . Diabetes mellitus without complication (HCC)   . MI (myocardial infarction)   . S/P triple vessel bypass    Past Surgical History:  Past Surgical History:  Procedure Laterality Date  . CARDIAC SURGERY     HPI:  Pt  is a 71 y.o. male with a known history of CHF, coronary artery disease status post bypass, history of previous MI, diabetes, history of aortic aneurysm repair who presents to the hospital due to shortness of breath, worsening lower extremity edema. Patient is visiting his family from [place] and is normally not short of breath. Shortness of breath now even on minimal exertion. He has had about a 10-15 pound weight gain over the past 2 weeks. He also admits to a cough which is productive with clear sputum, some mild paroxysmal nocturnal dyspnea and a baseline two-pillow orthopnea. He denies any chest pain, nausea, vomiting, palpitations or any other associated symptoms. Patient presented to the emergency room was noted to be in congestive heart failure and volume overloaded and hospitalist services were contacted further treatment and evaluation. Pt endorsed long-standing swallowing issues since 1990's after "my heart surgery". He stated the swallowing difficulties are not consistent but "I can get choked and strangled easily even on my own saliva". He continues to eat a regular diet at home stating no particular food/drink are causing the swallowing issues. He does endorse having to eat more slowly and several s/s of Reflux, especially at night when he "gets that sour, burning taste in my  mouth". He is not on a PPI and has not seen a GI re: the s/s of Reflux.    Assessment / Plan / Recommendation Clinical Impression  Pt appears at reduced risk for aspiration w/ oral intake when following general aspiration precautions. Pt consumed trials of thin liquids via cup and puree/soft solids w/ no overt s/s of aspiration noted; no decline in respiratory status w/ rest breaks b/t trials. Pt's oral phase appeared wfl for bolus management. Pt fed self given only min setup assistance. Pt appears at his baseline w/ swallowing w/ no overt dysphagia noted during oral intake at this evaluation. Pt denied any consistent deficits when eating/drinking carefully. Pt does appear to have baseline s/s of Reflux - unsure if this issue is contributing to his inconsistent dysphagia. Also educated pt on monitoring his respiratory status during exertion of eating/drinking at meals as this can have impact on his swallowing. Thorough education given on aspiration precautions; some reflux precautions. Recommend resume previous recommneded diet w/ meats cut well and moist for easier mastication. Recommend general aspiration and reflux precautions and meds in Puree IF any difficulty swallowing w/ liquids. NSG to continue to monitor at meals and will reconsult ST services if any change in status while admitted. NSG updated.     Aspiration Risk   (reduced following general aspiration precautions)    Diet Recommendation  Mech Soft diet w/ thin liquids; general aspiration precautions; Reflux precautions.    Medication Administration: Whole meds with puree    Other  Recommendations Recommended Consults: Consider GI evaluation;Consider esophageal assessment Oral Care Recommendations: Oral care  BID;Staff/trained caregiver to provide oral care   Follow up Recommendations None      Frequency and Duration            Prognosis Prognosis for Safe Diet Advancement: Good Barriers to Reach Goals:  (declined Pulmonary  status)      Swallow Study   General Date of Onset: 07/13/16 HPI: Pt  is a 71 y.o. male with a known history of CHF, coronary artery disease status post bypass, history of previous MI, diabetes, history of aortic aneurysm repair who presents to the hospital due to shortness of breath, worsening lower extremity edema. Patient is visiting his family from [place] and is normally not short of breath. Shortness of breath now even on minimal exertion. He has had about a 10-15 pound weight gain over the past 2 weeks. He also admits to a cough which is productive with clear sputum, some mild paroxysmal nocturnal dyspnea and a baseline two-pillow orthopnea. He denies any chest pain, nausea, vomiting, palpitations or any other associated symptoms. Patient presented to the emergency room was noted to be in congestive heart failure and volume overloaded and hospitalist services were contacted further treatment and evaluation. Pt endorsed long-standing swallowing issues since 1990's after "my heart surgery". He stated the swallowing difficulties are not consistent but "I can get choked and strangled easily even on my own saliva". He continues to eat a regular diet at home stating no particular food/drink are causing the swallowing issues. He does endorse having to eat more slowly and several s/s of Reflux, especially at night when he "gets that sour, burning taste in my mouth". He is not on a PPI and has not seen a GI re: the s/s of Reflux.  Type of Study: Bedside Swallow Evaluation Previous Swallow Assessment: none Diet Prior to this Study: Regular;Thin liquids Temperature Spikes Noted: No (wbc 4.7) Respiratory Status: Room air History of Recent Intubation: No Behavior/Cognition: Alert;Cooperative;Pleasant mood (talkative) Oral Cavity Assessment: Within Functional Limits Oral Care Completed by SLP: Recent completion by staff Oral Cavity - Dentition: Adequate natural dentition (bottom; partial on  top) Vision: Functional for self-feeding Self-Feeding Abilities: Able to feed self;Needs set up Patient Positioning: Upright in bed (EOB) Baseline Vocal Quality: Normal Volitional Cough: Strong Volitional Swallow: Able to elicit    Oral/Motor/Sensory Function Overall Oral Motor/Sensory Function: Within functional limits   Ice Chips Ice chips: Not tested   Thin Liquid Thin Liquid: Within functional limits Presentation: Cup;Self Fed (~3 ozs)    Nectar Thick Nectar Thick Liquid: Not tested   Honey Thick Honey Thick Liquid: Not tested   Puree Puree: Within functional limits Presentation: Self Fed;Spoon (~3-4 ozs)   Solid   GO   Solid: Within functional limits Presentation: Self Fed;Spoon (softened; 5 trials)        Jerilynn Som, MS, CCC-SLP Enedina Pair 07/17/2016,4:40 PM

## 2016-07-17 NOTE — Care Management Important Message (Signed)
Important Message  Patient Details  Name: Patrick Skinner MRN: [id number] Date of Birth: 10/25/1945   Medicare Important Message Given:  Yes    Patrick Skinner, Patrick Carrick R, RN 07/17/2016, 3:53 PM

## 2016-07-17 NOTE — Progress Notes (Signed)
[facility]  SUBJECTIVE: mild sob but improved over admission   Vitals:   07/16/16 1139 07/16/16 1951 07/17/16 0439 07/17/16 0754  BP: 130/62 (!) 104/57 130/63 (!) 107/59  Pulse: 70 68 82 88  Resp:   18 16  Temp: 97.4 F (36.3 C) 97.8 F (36.6 C) 97.6 F (36.4 C) 97.9 F (36.6 C)  TempSrc: Oral Oral  Oral  SpO2: 99% 99% 94% 95%  Weight:   232 lb 14.4 oz (105.6 kg)   Height:        Intake/Output Summary (Last 24 hours) at 07/17/16 0817 Last data filed at 07/17/16 0443  Gross per 24 hour  Intake             1010 ml  Output             1900 ml  Net             -890 ml    LABS: Basic Metabolic Panel:  Recent Labs  16/10/96 0708 07/16/16 0425 07/17/16 0426  NA 140 138 139  K 3.6 3.3* 3.7  CL 101 95* 98*  CO2 32 36* 34*  GLUCOSE 123* 125* 145*  BUN 21* 21* 24*  CREATININE 1.09 1.15 1.13  CALCIUM 9.0 9.1 9.1  MG 1.6* 1.9  --    Liver Function Tests: No results for input(s): AST, ALT, ALKPHOS, BILITOT, PROT, ALBUMIN in the last 72 hours. No results for input(s): LIPASE, AMYLASE in the last 72 hours. CBC:  Recent Labs  07/16/16 0425 07/17/16 0426  WBC 5.0 4.7  HGB 14.6 13.1  HCT 43.9 38.9*  MCV 102.9* 102.2*  PLT 160 139*   Cardiac Enzymes: No results for input(s): CKTOTAL, CKMB, CKMBINDEX, TROPONINI in the last 72 hours. BNP: Invalid input(s): POCBNP D-Dimer: No results for input(s): DDIMER in the last 72 hours. Hemoglobin A1C: No results for input(s): HGBA1C in the last 72 hours. Fasting Lipid Panel: No results for input(s): CHOL, HDL, LDLCALC, TRIG, CHOLHDL, LDLDIRECT in the last 72 hours. Thyroid Function Tests: No results for input(s): TSH, T4TOTAL, T3FREE, THYROIDAB in the last 72 hours.  Invalid input(s): FREET3 Anemia Panel: No results for input(s): VITAMINB12, FOLATE, FERRITIN, TIBC, IRON, RETICCTPCT in the last 72 hours.   Physical Exam: Blood pressure (!) 107/59, pulse 88, temperature 97.9 F (36.6  C), temperature source Oral, resp. rate 16, height 5\' 11"  (1.803 m), weight 232 lb 14.4 oz (105.6 kg), SpO2 95 %.   Wt Readings from Last 1 Encounters:  07/17/16 232 lb 14.4 oz (105.6 kg)     General appearance: alert and cooperative Resp: rhonchi bibasilar Cardio: irregularly irregular rhythm GI: soft, non-tender; bowel sounds normal; no masses,  no organomegaly Extremities: edema 1-2+ edema Skin: erythema on both legs.  Neurologic: Grossly normal  TELEMETRY: Reviewed telemetry pt in afib with controlled vr:  ASSESSMENT AND PLAN:  Active Problems:   CHF (congestive heart failure) (HCC)-cxr shows improvement in effusion and edema. Has diuresed 7.352 liters since admission. Minus 890 past 24 hours. Will continue with lasix 40 mg daily and follow weight and electrolytes. Continue with Entresto, spironolactone, and carvedilol. Will discontinue amlodipine.   afib-rate controlled with carvedilol. Continue with current dose of carvedilol and anticoagulate with eliquis. Defer cardioversion until adequately anticoagulated for 3-4 weeks. Ambulate in hall with assistance and follow heart rate in preparation for consideration for discharge.   Cellulitis-continue with rocephin iv until discharge and then would convert to oral broad spctrum abx.  Cardiomyopathy-ef 25-30%. Continue with above meds.  Patrick Skinner is covering for me this weekend.     Patrick HeadingKenneth A Rayssa Atha, MD, [facility] 07/17/2016 8:17 AM

## 2016-07-18 LAB — BASIC METABOLIC PANEL
Anion gap: 9 (ref 5–15)
BUN: 26 mg/dL — ABNORMAL HIGH (ref 6–20)
CHLORIDE: 97 mmol/L — AB (ref 101–111)
CO2: 30 mmol/L (ref 22–32)
Calcium: 8.7 mg/dL — ABNORMAL LOW (ref 8.9–10.3)
Creatinine, Ser: 1.2 mg/dL (ref 0.61–1.24)
GFR calc Af Amer: >60 mL/min (ref >60)
GFR calc non Af Amer: 60 mL/min — ABNORMAL LOW (ref >60)
GLUCOSE: 175 mg/dL — AB (ref 65–99)
POTASSIUM: 3.8 mmol/L (ref 3.5–5.1)
Sodium: 136 mmol/L (ref 135–145)

## 2016-07-18 LAB — GLUCOSE, CAPILLARY
GLUCOSE-CAPILLARY: 175 mg/dL — AB (ref 65–99)
GLUCOSE-CAPILLARY: 162 mg/dL — AB (ref 65–99)
Glucose-Capillary: 180 mg/dL — ABNORMAL HIGH (ref 65–99)
Glucose-Capillary: 259 mg/dL — ABNORMAL HIGH (ref 65–99)
Glucose-Capillary: 212 mg/dL — ABNORMAL HIGH (ref 65–99)

## 2016-07-18 LAB — CBC
HEMATOCRIT: 37.6 % — AB (ref 40.0–52.0)
HEMOGLOBIN: 12.5 g/dL — AB (ref 13.0–18.0)
MCH: 33.5 pg (ref 26.0–34.0)
MCHC: 33.3 g/dL (ref 32.0–36.0)
MCV: 100.7 fL — ABNORMAL HIGH (ref 80.0–100.0)
Platelets: 149 10*3/uL — ABNORMAL LOW (ref 150–440)
RBC: 3.73 MIL/uL — AB (ref 4.40–5.90)
RDW: 14 % (ref 11.5–14.5)
WBC: 5.1 10*3/uL (ref 3.8–10.6)

## 2016-07-18 MED ORDER — HYDROCODONE-ACETAMINOPHEN 5-325 MG PO TABS
1.0000 | ORAL_TABLET | Freq: Four times a day (QID) | ORAL | Status: DC | PRN
  Administered 2016-07-18 – 2016-07-20 (×7): 2 via ORAL
  Filled 2016-07-18 (×8): qty 2

## 2016-07-18 NOTE — Progress Notes (Signed)
Speech Therapy note: provided pt w/ handouts on general aspiration and Reflux precautions. Pt denied any swallowing deficits at meals since yesterday pm. NSG to reconsult ST services if any change in status while admitted.   Jerilynn SomKatherine Watson, MS, CCC-SLP

## 2016-07-18 NOTE — Progress Notes (Signed)
[facility] - [place] at [facility]   PATIENT NAME: Patrick Skinner    MR#:  [id number]  DATE OF BIRTH:  05/29/1946  SUBJECTIVE:  CHIEF COMPLAINT:  Patient is visiting his family members here, came from [place]  Still has redness on the lower extremities but swelling is improving  Sadie feels much better than the time he came but still have weakness and swelling.  REVIEW OF SYSTEMS:  CONSTITUTIONAL: No fever, fatigue or weakness.  EYES: No blurred or double vision.  EARS, NOSE, AND THROAT: No tinnitus or ear pain.  RESPIRATORY: No cough, shortness of breathWhile resting, no wheezing or hemoptysis.  CARDIOVASCULAR: No chest pain, orthopnea, edema.  GASTROINTESTINAL: No nausea, vomiting, diarrhea or abdominal pain.  GENITOURINARY: No dysuria, hematuria.  ENDOCRINE: No polyuria, nocturia,  HEMATOLOGY: No anemia, easy bruising or bleeding SKIN:Is reporting scrotal edema is improving No rash or lesion. MUSCULOSKELETAL: Reporting significant lower extremity swelling nd noticing redness No joint pain or arthritis.   NEUROLOGIC: No tingling, numbness, weakness.  PSYCHIATRY: No anxiety or depression.   DRUG ALLERGIES:  No Known Allergies  VITALS:  Blood pressure 120/71, pulse 76, temperature 97.6 F (36.4 C), temperature source Oral, resp. rate 18, height 5\' 11"  (1.803 m), weight 106.6 kg (235 lb 1.6 oz), SpO2 94 %.  PHYSICAL EXAMINATION:  GENERAL:  71 y.o.-year-old patient lying in the bed with no acute distress.  EYES: Pupils equal, round, reactive to light and accommodation. No scleral icterus. Extraocular muscles intact.  HEENT: Head atraumatic, normocephalic. Oropharynx and nasopharynx clear.  NECK:  Supple, no jugular venous distention. No thyroid enlargement, no tenderness.  LUNGS: Moderate breath sounds bilaterally, no wheezing, rales,rhonchi or crepitation. No use of accessory muscles of respiration.  CARDIOVASCULAR: Irregularly irregular. No  murmurs, rubs, or gallops.  ABDOMEN: Soft, nontender, nondistended. Bowel sounds present. No organomegaly or mass.  EXTREMITIES: 1+ pedal edema, no cyanosis, or clubbing. Erythematous macular papular rash is noted on bilateral lower extremities extending up to the knees, confluent NEUROLOGIC: Cranial nerves II through XII are intact. Muscle strength 5/5 in all extremities. Sensation intact. Gait not checked.  PSYCHIATRIC: The patient is alert and oriented x 3.  SKIN: Redness in both legs.    LABORATORY PANEL:   CBC  Recent Labs Lab 07/18/16 0621  WBC 5.1  HGB 12.5*  HCT 37.6*  PLT 149*   ------------------------------------------------------------------------------------------------------------------  Chemistries   Recent Labs Lab 07/13/16 1355  07/16/16 0425  07/18/16 0621  NA 135  < > 138  < > 136  K 4.1  < > 3.3*  < > 3.8  CL 99*  < > 95*  < > 97*  CO2 28  < > 36*  < > 30  GLUCOSE 118*  < > 125*  < > 175*  BUN 22*  < > 21*  < > 26*  CREATININE 1.13  < > 1.15  < > 1.20  CALCIUM 9.1  < > 9.1  < > 8.7*  MG  --   < > 1.9  --   --   AST 16  --   --   --   --   ALT 12*  --   --   --   --   ALKPHOS 96  --   --   --   --   BILITOT 1.3*  --   --   --   --   < > = values in this interval not displayed. ------------------------------------------------------------------------------------------------------------------  Cardiac  Enzymes  Recent Labs Lab 07/14/16 0240  TROPONINI <0.03   ------------------------------------------------------------------------------------------------------------------  RADIOLOGY:  No results found.  EKG:   Orders placed or performed during the hospital encounter of 07/13/16  . ED EKG  . ED EKG  . EKG 12-Lead  . EKG 12-Lead    ASSESSMENT AND PLAN:   71 year old male from [place] with past medical history of obesity, diabetes, hypertension, coronary artery disease status post bypass, history of previous MI, CHF who presents to  the hospital due to shortness of breath, lower extremity edema.  1. Acute systolic CHF exacerbation with anasarca and scrotal edema. -clinically improving,net output 7882 mL since admission -Echocardiogram with left ventricular ejection fraction 25-30% -aggressively diuresed him with IV Lasix and Change to PO lasix 40 mg daily -Continue Coreg, irbesartan. -Amlodipine dose reduced to 5 mg eventually discontinued -Spironolactone 25 mg is added to the regimen -[facility]  -considering ICD placement after patient is medically stabilized,OP - Counseled in detail about fluid restriction and salt restriction, patient did not know about that up until now.  2. Atrial fibrillation-patient has had a previous history of atrial fibrillation but was cardioverted years ago when he had his bypass.  -He is rate controlled. -continue carvedilol.  - started him on anticoagulation with Eliquis 5 mg by mouth twice a day  Cardiology is agreeable , they may plan for cardioversion later.  3. Bilateral lower extending to cellulitis Patient is started on IV Rocephin, continue the same DVT ruled out with negative venous Dopplers Encouraged patient to elevate his legs by 2 pillows Norco for pain management, increase dose as patient still her pain.  3. 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  4. Essential hypertension-continue carvedilol, Norvasc dose reduced to 5 mg, irbesartan.  5. Diabetic neuropathy-continue gabapentin.  6. Hyperlipidemia-continue simvastatin.   PT consult -recommending home health PT, rolling walker with 5 inch wheels  All the records are reviewed and case discussed with Care Management/Social Workerr. Management plans discussed with the patient, family and they are in agreement.  CODE STATUS: fc  TOTAL TIME TAKING CARE OF THIS PATIENT: 35  minutes.   POSSIBLE D/C IN 1-2  DAYS, DEPENDING ON CLINICAL  CONDITION.  Note: This dictation was prepared with Dragon dictation along with smaller phrase technology. Any transcriptional errors that result from this process are unintentional.   Altamese DillingVACHHANI, Jahaziel Francois M.D on 07/18/2016 at 6:28 PM  Between 7am to 6pm - Pager - [phone] After 6pm go to [email or web address] - password [email or web address] [email or web address]  [facility]  Office  [phone]  CC: Primary care physician; No PCP Per Patient

## 2016-07-19 LAB — GLUCOSE, CAPILLARY
GLUCOSE-CAPILLARY: 185 mg/dL — AB (ref 65–99)
GLUCOSE-CAPILLARY: 211 mg/dL — AB (ref 65–99)
Glucose-Capillary: 221 mg/dL — ABNORMAL HIGH (ref 65–99)

## 2016-07-19 MED ORDER — DEXTROMETHORPHAN POLISTIREX ER 30 MG/5ML PO SUER
30.0000 mg | Freq: Two times a day (BID) | ORAL | Status: DC | PRN
  Administered 2016-07-19: 30 mg via ORAL
  Filled 2016-07-19 (×2): qty 5

## 2016-07-19 MED ORDER — CARVEDILOL 6.25 MG PO TABS
6.2500 mg | ORAL_TABLET | Freq: Two times a day (BID) | ORAL | Status: DC
  Administered 2016-07-19 – 2016-07-20 (×2): 6.25 mg via ORAL
  Filled 2016-07-19 (×2): qty 1

## 2016-07-19 MED ORDER — GUAIFENESIN ER 600 MG PO TB12
600.0000 mg | ORAL_TABLET | Freq: Two times a day (BID) | ORAL | Status: DC | PRN
  Administered 2016-07-19: 600 mg via ORAL
  Filled 2016-07-19: qty 1

## 2016-07-19 NOTE — Progress Notes (Signed)
[facility] [place] [facility]   PATIENT NAME: Patrick Skinner    MR#:  [id number]  DATE OF BIRTH:  04/03/1946  SUBJECTIVE:  CHIEF COMPLAINT:  Patient is visiting his family members here, [place]   Still has redness on the lower extremities but swelling is improving  said he feels much better than the time he came but still have weakness and swelling.  able to ambulate today.  REVIEW OF SYSTEMS:  CONSTITUTIONAL: No fever, fatigue or weakness.  EYES: No blurred or double vision.  EARS, NOSE, AND THROAT: No tinnitus or ear pain.  RESPIRATORY: No cough, shortness of breathWhile resting, no wheezing or hemoptysis.  CARDIOVASCULAR: No chest pain, orthopnea, edema.  GASTROINTESTINAL: No nausea, vomiting, diarrhea or abdominal pain.  GENITOURINARY: No dysuria, hematuria.  ENDOCRINE: No polyuria, nocturia,  HEMATOLOGY: No anemia, easy bruising or bleeding SKIN:Is reporting scrotal edema is improving No rash or lesion. MUSCULOSKELETAL: Reporting significant lower extremity swelling nd noticing redness No joint pain or arthritis.   NEUROLOGIC: No tingling, numbness, weakness.  PSYCHIATRY: No anxiety or depression.   DRUG ALLERGIES:  No Known Allergies  VITALS:  Blood pressure (!) 105/45, pulse (!) 42, temperature 97.7 F (36.5 C), temperature source Oral, resp. rate 18, height 5\' 11"  (1.803 m), weight 106.5 kg (234 lb 12.8 oz), SpO2 98 %.  PHYSICAL EXAMINATION:  GENERAL:  71 y.o.-year-old patient lying in the bed with no acute distress.  EYES: Pupils equal, round, reactive to light and accommodation. No scleral icterus. Extraocular muscles intact.  HEENT: Head atraumatic, normocephalic. Oropharynx and nasopharynx clear.  NECK:  Supple, no jugular venous distention. No thyroid enlargement, no tenderness.  LUNGS: Moderate breath sounds bilaterally, no wheezing, rales,rhonchi or crepitation. No use of accessory muscles of respiration.   CARDIOVASCULAR: Irregularly irregular. No murmurs, rubs, or gallops.  ABDOMEN: Soft, nontender, nondistended. Bowel sounds present. No organomegaly or mass.  EXTREMITIES: 1+ pedal edema, no cyanosis, or clubbing. Erythematous macular papular rash is noted on bilateral lower extremities extending up to the knees, confluent NEUROLOGIC: Cranial nerves II through XII are intact. Muscle strength 5/5 in all extremities. Sensation intact. Gait not checked.  PSYCHIATRIC: The patient is alert and oriented x 3.  SKIN: Redness in both legs.    LABORATORY PANEL:   CBC  Recent Labs Lab 07/18/16 0621  WBC 5.1  HGB 12.5*  HCT 37.6*  PLT 149*   ------------------------------------------------------------------------------------------------------------------  Chemistries   Recent Labs Lab 07/13/16 1355  07/16/16 0425  07/18/16 0621  NA 135  < > 138  < > 136  K 4.1  < > 3.3*  < > 3.8  CL 99*  < > 95*  < > 97*  CO2 28  < > 36*  < > 30  GLUCOSE 118*  < > 125*  < > 175*  BUN 22*  < > 21*  < > 26*  CREATININE 1.13  < > 1.15  < > 1.20  CALCIUM 9.1  < > 9.1  < > 8.7*  MG  --   < > 1.9  --   --   AST 16  --   --   --   --   ALT 12*  --   --   --   --   ALKPHOS 96  --   --   --   --   BILITOT 1.3*  --   --   --   --   < > =  values in this interval not displayed. ------------------------------------------------------------------------------------------------------------------  Cardiac Enzymes  Recent Labs Lab 07/14/16 0240  TROPONINI <0.03   ------------------------------------------------------------------------------------------------------------------  RADIOLOGY:  No results found.  EKG:   Orders placed or performed during the hospital encounter of 07/13/16  . ED EKG  . ED EKG  . EKG 12-Lead  . EKG 12-Lead    ASSESSMENT AND PLAN:   71 year old male from [place] with past medical history of obesity, diabetes, hypertension, coronary artery disease status post bypass,  history of previous MI, CHF who presents to the hospital due to shortness of breath, lower extremity edema.  1. Acute systolic CHF exacerbation with anasarca and scrotal edema. -clinically improving,net output 8000 mL since admission -Echocardiogram with left ventricular ejection fraction 25-30% -aggressively diuresed him with IV Lasix and Change to PO lasix 40 mg daily -Continue Coreg, irbesartan. -Amlodipine dose reduced to 5 mg eventually discontinued -Spironolactone 25 mg is added to the regimen -Follow up with [facility] cardiology  -considering ICD placement after patient is medically stabilized,OP - Counseled in detail about fluid restriction and salt restriction, patient did not know about that up until now.  2. Atrial fibrillation-patient has had a previous history of atrial fibrillation but was cardioverted years ago when he had his bypass.  -He is rate controlled. -continue carvedilol.  - started him on anticoagulation with Eliquis 5 mg by mouth twice a day  Cardiology is agreeable , they may plan for cardioversion later. - Decreased coreg dose as had some brady episodes.  3. Bilateral lower extending to cellulitis Patient is started on IV Rocephin, continue the same DVT ruled out with negative venous Dopplers Encouraged patient to elevate his legs by 2 pillows Norco for pain management, increase dose as patient still her pain.  3. 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  4. Essential hypertension-continue carvedilol, Norvasc dose reduced to 5 mg, irbesartan.spironolactone.  5. Diabetic neuropathy-continue gabapentin.  6. Hyperlipidemia-continue simvastatin.   PT consult -recommending home health PT, rolling walker with 5 inch wheels  All the records are reviewed and case discussed with Care Management/Social Workerr. Management plans discussed with the patient, family and they are in agreement.  CODE STATUS:  fc  TOTAL TIME TAKING CARE OF THIS PATIENT: 35  minutes.   POSSIBLE D/C IN 1-2  DAYS, DEPENDING ON CLINICAL CONDITION.  Note: This dictation was prepared with Dragon dictation along with smaller phrase technology. Any transcriptional errors that result from this process are unintentional.   Altamese Dilling M.D on 07/19/2016 at 9:49 AM  Between 7am to 6pm - Pager - [phone] After 6pm go to [email or web address] - password [email or web address] [facility]  [place] [facility]  Office  [phone]  CC: Primary care physician; No PCP Per Patient

## 2016-07-20 LAB — BASIC METABOLIC PANEL
Anion gap: 8 (ref 5–15)
BUN: 28 mg/dL — AB (ref 6–20)
CHLORIDE: 98 mmol/L — AB (ref 101–111)
CO2: 29 mmol/L (ref 22–32)
CREATININE: 1.1 mg/dL (ref 0.61–1.24)
Calcium: 8.8 mg/dL — ABNORMAL LOW (ref 8.9–10.3)
GFR calc Af Amer: >60 mL/min (ref >60)
GFR calc non Af Amer: >60 mL/min (ref >60)
Glucose, Bld: 193 mg/dL — ABNORMAL HIGH (ref 65–99)
POTASSIUM: 4.2 mmol/L (ref 3.5–5.1)
SODIUM: 135 mmol/L (ref 135–145)

## 2016-07-20 LAB — GLUCOSE, CAPILLARY
GLUCOSE-CAPILLARY: 183 mg/dL — AB (ref 65–99)
GLUCOSE-CAPILLARY: 202 mg/dL — AB (ref 65–99)
Glucose-Capillary: 162 mg/dL — ABNORMAL HIGH (ref 65–99)

## 2016-07-20 MED ORDER — DIAZEPAM 5 MG PO TABS: 5.0000 mg | ORAL_TABLET | Freq: Every day | ORAL | 0 refills | Status: AC

## 2016-07-20 MED ORDER — HYDROCODONE-ACETAMINOPHEN 5-325 MG PO TABS: 1.0000 | ORAL_TABLET | Freq: Four times a day (QID) | ORAL | 0 refills | Status: DC | PRN

## 2016-07-20 MED ORDER — ASPIRIN 81 MG PO TBEC: 81.0000 mg | DELAYED_RELEASE_TABLET | Freq: Every day | ORAL | 0 refills | Status: AC

## 2016-07-20 MED ORDER — APIXABAN 5 MG PO TABS: 5.0000 mg | ORAL_TABLET | Freq: Two times a day (BID) | ORAL | 0 refills | Status: AC

## 2016-07-20 MED ORDER — DEXTROMETHORPHAN POLISTIREX ER 30 MG/5ML PO SUER: 30.0000 mg | Freq: Two times a day (BID) | ORAL | 0 refills | Status: DC | PRN

## 2016-07-20 MED ORDER — POTASSIUM CHLORIDE CRYS ER 10 MEQ PO TBCR: 10.0000 meq | EXTENDED_RELEASE_TABLET | Freq: Two times a day (BID) | ORAL | 0 refills | Status: DC

## 2016-07-20 MED ORDER — FUROSEMIDE 40 MG PO TABS: 40.0000 mg | ORAL_TABLET | Freq: Every day | ORAL | 0 refills | Status: AC

## 2016-07-20 MED ORDER — CEFUROXIME AXETIL 250 MG PO TABS: 250.0000 mg | ORAL_TABLET | Freq: Two times a day (BID) | ORAL | 0 refills | Status: DC

## 2016-07-20 MED ORDER — SACUBITRIL-VALSARTAN 24-26 MG PO TABS: 1.0000 | ORAL_TABLET | Freq: Two times a day (BID) | ORAL | 0 refills | Status: AC

## 2016-07-20 MED ORDER — INSULIN DEGLUDEC 100 UNIT/ML ~~LOC~~ SOPN: 10.0000 [IU] | PEN_INJECTOR | Freq: Every day | SUBCUTANEOUS | 0 refills | Status: AC

## 2016-07-20 MED ORDER — CARVEDILOL 6.25 MG PO TABS: 6.2500 mg | ORAL_TABLET | Freq: Two times a day (BID) | ORAL | 0 refills | Status: AC

## 2016-07-20 MED ORDER — GUAIFENESIN ER 600 MG PO TB12: 600.0000 mg | ORAL_TABLET | Freq: Two times a day (BID) | ORAL | 0 refills | Status: DC | PRN

## 2016-07-20 MED ORDER — SPIRONOLACTONE 25 MG PO TABS: 25.0000 mg | ORAL_TABLET | Freq: Every day | ORAL | 0 refills | Status: AC

## 2016-07-20 NOTE — Progress Notes (Signed)
[facility]  SUBJECTIVE: Pt is able to ambulate with out difficulty in his room and the hall way. Heart rate in good control. [name] diuresed 8.8 liters since admission.    Vitals:   07/19/16 1938 07/20/16 0435 07/20/16 0441 07/20/16 1153  BP: 119/64  116/64 126/64  Pulse: 66  82 80  Resp:   18 20  Temp: 97.9 F (36.6 C)  97.7 F (36.5 C) 97.3 F (36.3 C)  TempSrc: Oral  Oral Oral  SpO2: 98%  93% 96%  Weight:  236 lb 1.6 oz (107.1 kg)    Height:        Intake/Output Summary (Last 24 hours) at 07/20/16 1343 Last data filed at 07/20/16 1157  Gross per 24 hour  Intake              930 ml  Output             2005 ml  Net            -1075 ml    LABS: Basic Metabolic Panel:  Recent Labs  16/04/9600/06/18 0621 07/20/16 0631  NA 136 135  K 3.8 4.2  CL 97* 98*  CO2 30 29  GLUCOSE 175* 193*  BUN 26* 28*  CREATININE 1.20 1.10  CALCIUM 8.7* 8.8*   Liver Function Tests: No results for input(s): AST, ALT, ALKPHOS, BILITOT, PROT, ALBUMIN in the last 72 hours. No results for input(s): LIPASE, AMYLASE in the last 72 hours. CBC:  Recent Labs  07/18/16 0621  WBC 5.1  HGB 12.5*  HCT 37.6*  MCV 100.7*  PLT 149*   Cardiac Enzymes: No results for input(s): CKTOTAL, CKMB, CKMBINDEX, TROPONINI in the last 72 hours. BNP: Invalid input(s): POCBNP D-Dimer: No results for input(s): DDIMER in the last 72 hours. Hemoglobin A1C: No results for input(s): HGBA1C in the last 72 hours. Fasting Lipid Panel: No results for input(s): CHOL, HDL, LDLCALC, TRIG, CHOLHDL, LDLDIRECT in the last 72 hours. Thyroid Function Tests: No results for input(s): TSH, T4TOTAL, T3FREE, THYROIDAB in the last 72 hours.  Invalid input(s): FREET3 Anemia Panel: No results for input(s): VITAMINB12, FOLATE, FERRITIN, TIBC, IRON, RETICCTPCT in the last 72 hours.   Physical Exam: Blood pressure 126/64, pulse 80, temperature 97.3 F (36.3 C), temperature source Oral, resp. rate  20, height 5\' 11"  (1.803 m), weight 236 lb 1.6 oz (107.1 kg), SpO2 96 %.   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 OK for discharge on entresto, carvedilol, lasix, spironolactone and follow up in 1 week in our office.   afib-rate controlled with carvedilol. Continue this and eliquis at 5 mg bid. OK for discharge from cardaic standpoint. Follow up as planned above.     Dalia HeadingKenneth A Fath, MD, [facility] 07/20/2016 1:43 PM

## 2016-07-20 NOTE — Discharge Summary (Signed)
[facility] - [place] at [facility]   PATIENT NAME: Patrick Skinner    MR#:  [id number]  DATE OF BIRTH:  [birth year]  DATE OF ADMISSION:  07/13/2016 ADMITTING PHYSICIAN: Houston Siren, MD  DATE OF DISCHARGE: 07/20/2016  PRIMARY CARE PHYSICIAN: No PCP Per Patient    ADMISSION DIAGNOSIS:  Acute on chronic congestive heart failure, unspecified congestive heart failure type (HCC) [I50.9]  DISCHARGE DIAGNOSIS:  Active Problems:   CHF (congestive heart failure) (HCC)  A fib   cellulitis  SECONDARY DIAGNOSIS:   Past Medical History:  Diagnosis Date  . Aortic aneurysm (HCC)   . CHF (congestive heart failure) (HCC)   . Diabetes mellitus without complication (HCC)   . MI (myocardial infarction)   . S/P triple vessel bypass     HOSPITAL COURSE:   71 year old male from [place] with past medical history of obesity, diabetes, hypertension, coronary artery disease status post bypass, history of previous MI, CHF who presents to the hospital due to shortness of breath, lower extremity edema.  1. Acute systolic CHF exacerbation with anasarca and scrotal edema. -clinically improving,net output 8000 mL since admission -Echocardiogram with left ventricular ejection fraction 25-30% -aggressively diuresed him with IV Lasix and Change to PO lasix 40 mg daily -Continue Coreg, irbesartan. -Amlodipine dose reduced to 5 mg eventually discontinued -Spironolactone 25 mg is added to the regimen -Follow up with [facility] cardiology  -considering ICD placement after patient is medically stabilized,OP - Counseled in detail about fluid restriction and salt restriction, patient did not know about that up until now.  2. Atrial fibrillation-patient has had a previous history of atrial fibrillation but was cardioverted years ago when he had his bypass.  -He is rate controlled. -continue carvedilol.  - started him on anticoagulation with Eliquis 5 mg by mouth twice a day   Cardiology is agreeable , they may plan for cardioversion later. - Decreased coreg dose as had some brady episodes.  3. Bilateral lower extending to cellulitis Patient is started on IV Rocephin, continue the same DVT ruled out with negative venous Dopplers Encouraged patient to elevate his legs by 2 pillows Norco for pain management, controlled now.  3. 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  4. Essential hypertension-continue carvedilol, Norvasc dose reduced to 5 mg, irbesartan.spironolactone.  5. Diabetic neuropathy-continue gabapentin.  6. Hyperlipidemia-continue simvastatin.   DISCHARGE CONDITIONS:   Stable./  CONSULTS OBTAINED:  Treatment Team:  Alwyn Pea, MD Dalia Heading, MD  DRUG ALLERGIES:  No Known Allergies  DISCHARGE MEDICATIONS:   Current Discharge Medication List    START taking these medications   Details  apixaban (ELIQUIS) 5 MG TABS tablet Take 1 tablet (5 mg total) by mouth 2 (two) times daily. Qty: 60 tablet, Refills: 0    aspirin EC 81 MG EC tablet Take 1 tablet (81 mg total) by mouth daily. Qty: 30 tablet, Refills: 0    cefUROXime (CEFTIN) 250 MG tablet Take 1 tablet (250 mg total) by mouth 2 (two) times daily with a meal. Qty: 8 tablet, Refills: 0    dextromethorphan (DELSYM) 30 MG/5ML liquid Take 5 mLs (30 mg total) by mouth 2 (two) times daily as needed for cough. Qty: 89 mL, Refills: 0    guaiFENesin (MUCINEX) 600 MG 12 hr tablet Take 1 tablet (600 mg total) by mouth 2 (two) times daily as needed for cough or to loosen phlegm. Qty: 20 tablet, Refills: 0  HYDROcodone-acetaminophen (NORCO/VICODIN) 5-325 MG tablet Take 1 tablet by mouth every 6 (six) hours as needed for moderate pain or severe pain. Qty: 20 tablet, Refills: 0    potassium chloride (K-DUR,KLOR-CON) 10 MEQ tablet Take 1 tablet (10 mEq total) by mouth 2 (two) times daily. Qty: 60 tablet, Refills: 0     sacubitril-valsartan (ENTRESTO) 24-26 MG Take 1 tablet by mouth 2 (two) times daily. Qty: 60 tablet, Refills: 0    spironolactone (ALDACTONE) 25 MG tablet Take 1 tablet (25 mg total) by mouth daily. Qty: 30 tablet, Refills: 0      CONTINUE these medications which have CHANGED   Details  carvedilol (COREG) 6.25 MG tablet Take 1 tablet (6.25 mg total) by mouth 2 (two) times daily with a meal. Qty: 60 tablet, Refills: 0    diazepam (VALIUM) 5 MG tablet Take 1 tablet (5 mg total) by mouth at bedtime. Qty: 20 tablet, Refills: 0    furosemide (LASIX) 40 MG tablet Take 1 tablet (40 mg total) by mouth daily. Qty: 30 tablet, Refills: 0    insulin degludec (TRESIBA FLEXTOUCH) 100 UNIT/ML SOPN FlexTouch Pen Inject 0.1 mLs (10 Units total) into the skin at bedtime. Qty: 3 mL, Refills: 0      CONTINUE these medications which have NOT CHANGED   Details  albuterol (PROVENTIL HFA;VENTOLIN HFA) 108 (90 Base) MCG/ACT inhaler Inhale 2 puffs into the lungs every 6 (six) hours as needed for wheezing or shortness of breath.    ergocalciferol (VITAMIN D2) 50000 units capsule Take 50,000 Units by mouth once a week.    gabapentin (NEURONTIN) 600 MG tablet Take 600-900 mg by mouth 2 (two) times daily. Take 600 mg in the morning and 900 mg in the evening.    simvastatin (ZOCOR) 40 MG tablet Take 40 mg by mouth daily.      STOP taking these medications     amLODipine (NORVASC) 10 MG tablet      aspirin 325 MG tablet      irbesartan (AVAPRO) 300 MG tablet      Phenazopyridine HCl (URISTAT PO)          DISCHARGE INSTRUCTIONS:    Follow with cardiology clinic in 1-2 weeks.  If you experience worsening of your admission symptoms, develop shortness of breath, life threatening emergency, suicidal or homicidal thoughts you must seek medical attention immediately by calling 911 or calling your MD immediately  if symptoms less severe.  You Must read complete instructions/literature along with all  the possible adverse reactions/side effects for all the Medicines you take and that have been prescribed to you. Take any new Medicines after you have completely understood and accept all the possible adverse reactions/side effects.   Please note  You were cared for by a hospitalist during your hospital stay. If you have any questions about your discharge medications or the care you received while you were in the hospital after you are discharged, you can call the unit and asked to speak with the hospitalist on call if the hospitalist that took care of you is not available. Once you are discharged, your primary care physician will handle any further medical issues. Please note that NO REFILLS for any discharge medications will be authorized once you are discharged, as it is imperative that you return to your primary care physician (or establish a relationship with a primary care physician if you do not have one) for your aftercare needs so that they can reassess your need for medications and  monitor your lab values.    Today   CHIEF COMPLAINT:   Chief Complaint  Patient presents with  . Shortness of Breath  . Leg Swelling    HISTORY OF PRESENT ILLNESS:  Patrick Skinner  is a 71 y.o. male with a known history of  CHF, coronary artery disease status post bypass, history of previous MI, diabetes, history of aortic aneurysm repair who presents to the hospital due to shortness of breath, worsening lower extremity edema. Patient is visiting his family from [place] and is normally not short of breath. Shortness of breath now even on minimal exertion. He has had about a 10-15 pound weight gain over the past 2 weeks. He also admits to a cough which is productive with clear sputum, some mild paroxysmal nocturnal dyspnea and a baseline two-pillow orthopnea. He denies any chest pain, nausea, vomiting, palpitations or any other associated symptoms. Patient presented to the emergency room was noted to be  in congestive heart failure and volume overloaded and hospitalist services were contacted further treatment and evaluation.   VITAL SIGNS:  Blood pressure 126/64, pulse 80, temperature 97.3 F (36.3 C), temperature source Oral, resp. rate 20, height 5\' 11"  (1.803 m), weight 107.1 kg (236 lb 1.6 oz), SpO2 96 %.  I/O:   Intake/Output Summary (Last 24 hours) at 07/20/16 1329 Last data filed at 07/20/16 1157  Gross per 24 hour  Intake              930 ml  Output             2005 ml  Net            -1075 ml    PHYSICAL EXAMINATION:   GENERAL:  71 y.o.-year-old patient lying in the bed with no acute distress.  EYES: Pupils equal, round, reactive to light and accommodation. No scleral icterus. Extraocular muscles intact.  HEENT: Head atraumatic, normocephalic. Oropharynx and nasopharynx clear.  NECK:  Supple, no jugular venous distention. No thyroid enlargement, no tenderness.  LUNGS: Moderate breath sounds bilaterally, no wheezing, rales,rhonchi or crepitation. No use of accessory muscles of respiration.  CARDIOVASCULAR: Irregularly irregular. No murmurs, rubs, or gallops.  ABDOMEN: Soft, nontender, nondistended. Bowel sounds present. No organomegaly or mass.  EXTREMITIES: 1+ pedal edema, no cyanosis, or clubbing. Erythematous macular papular rash is noted on bilateral lower extremities extending up to the knees, confluent NEUROLOGIC: Cranial nerves II through XII are intact. Muscle strength 5/5 in all extremities. Sensation intact. Gait not checked.  PSYCHIATRIC: The patient is alert and oriented x 3.  SKIN: Redness in both legs.   DATA REVIEW:   CBC  Recent Labs Lab 07/18/16 0621  WBC 5.1  HGB 12.5*  HCT 37.6*  PLT 149*    Chemistries   Recent Labs Lab 07/13/16 1355  07/16/16 0425  07/20/16 0631  NA 135  < > 138  < > 135  K 4.1  < > 3.3*  < > 4.2  CL 99*  < > 95*  < > 98*  CO2 28  < > 36*  < > 29  GLUCOSE 118*  < > 125*  < > 193*  BUN 22*  < > 21*  < > 28*   CREATININE 1.13  < > 1.15  < > 1.10  CALCIUM 9.1  < > 9.1  < > 8.8*  MG  --   < > 1.9  --   --   AST 16  --   --   --   --  ALT 12*  --   --   --   --   ALKPHOS 96  --   --   --   --   BILITOT 1.3*  --   --   --   --   < > = values in this interval not displayed.  Cardiac Enzymes  Recent Labs Lab 07/14/16 0240  TROPONINI <0.03    Microbiology Results  No results found for this or any previous visit.  RADIOLOGY:  No results found.  EKG:   Orders placed or performed during the hospital encounter of 07/13/16  . ED EKG  . ED EKG  . EKG 12-Lead  . EKG 12-Lead      Management plans discussed with the patient, family and they are in agreement.  CODE STATUS:     Code Status Orders        Start     Ordered   07/13/16 1834  Full code  Continuous     07/13/16 1833    Code Status History    Date Active Date Inactive Code Status Order ID Comments User Context   This patient has a current code status but no historical code status.    Advance Directive Documentation   Flowsheet Row Most Recent Value  Type of Advance Directive  Living will  Pre-existing out of facility DNR order (yellow form or pink MOST form)  No data  "MOST" Form in Place?  No data      TOTAL TIME TAKING CARE OF THIS PATIENT: 35 minutes.    Altamese DillingVACHHANI, Teffany Blaszczyk M.D on 07/20/2016 at 1:29 PM  Between 7am to 6pm - Pager - [phone]  After 6pm go to [email or web address] - Social research officer, governmentpassword [email or web address]  [facility]  Office  [phone]  CC: Primary care physician; No PCP Per Patient   Note: This dictation was prepared with Dragon dictation along with smaller phrase technology. Any transcriptional errors that result from this process are unintentional.

## 2016-07-20 NOTE — Care Management Important Message (Signed)
Important Message  Patient Details  Name: Patrick Skinner MRN: [id number] Date of Birth: 10/16/1945   Medicare Important Message Given:  Yes    Eber HongGreene, Shaquan Puerta R, RN 07/20/2016, 9:19 AM

## 2016-07-20 NOTE — Progress Notes (Signed)
Inpatient Diabetes Program Recommendations  AACE/ADA: New Consensus Statement on Inpatient Glycemic Control (2015)  Target Ranges:  Prepandial:   less than 140 mg/dL      Peak postprandial:   less than 180 mg/dL (1-2 hours)      Critically ill patients:  140 - 180 mg/dL   Results for Ok AnisRICHARDS, Rorik R (MRN [id number]) as of 07/20/2016 08:59  Ref. Range 07/19/2016 07:26 07/19/2016 11:34 07/19/2016 21:17 07/20/2016 08:20  Glucose-Capillary Latest Ref Range: 65 - 99 mg/dL 401185 (H) 027211 (H) 253221 (H) 183 (H)   Review of Glycemic Control  Diabetes history: DM2 Outpatient Diabetes medications: Tresiba 20 units QHS Current orders for Inpatient glycemic control: Novolog 0-9 units TID with meals, Novolog 0-5 units QHS  Inpatient Diabetes Program Recommendations: Insulin - Basal: Please consider ordering Lantus 5 units Q24H.  Thanks, Orlando PennerMarie Floriene Jeschke, RN, MSN, CDE Diabetes Coordinator Inpatient Diabetes Program [phone] (Team Pager from 8am to 5pm)

## 2016-07-20 NOTE — Progress Notes (Signed)
Patient ambulated around the nurses' desk without any respiratory issues. Patient's pulse ox is 98% at room air.

## 2016-07-20 NOTE — Progress Notes (Signed)
ANTICOAGULATION CONSULT NOTE - follow up Consult  Pharmacy Consult for apixaban Indication: atrial fibrillation  No Known Allergies  Patient Measurements: Height: 5\' 11"  (180.3 cm) Weight: 236 lb 1.6 oz (107.1 kg) IBW/kg (Calculated) : 75.3  Vital Signs: Temp: 97.3 F (36.3 C) (01/08 1153) Temp Source: Oral (01/08 1153) BP: 126/64 (01/08 1153) Pulse Rate: 80 (01/08 1153)  Labs:  Recent Labs  07/18/16 0621 07/20/16 0631  HGB 12.5*  --   HCT 37.6*  --   PLT 149*  --   CREATININE 1.20 1.10    Estimated Creatinine Clearance: 77.8 mL/min (by C-G formula based on SCr of 1.1 mg/dL).   Medical History: Past Medical History:  Diagnosis Date  . Aortic aneurysm (HCC)   . CHF (congestive heart failure) (HCC)   . Diabetes mellitus without complication (HCC)   . MI (myocardial infarction)   . S/P triple vessel bypass     Assessment: 71yo male with nonvalvular atrial fibrillation. Not previously on anticoagulation. Per previous note pt counseled by pharmacy on 1/3.  Goal of Therapy:  Monitor platelets by anticoagulation protocol: Yes   Plan:  Continue apixaban 5mg  PO BID.    Marty HeckWang, Lynisha Osuch L, PharmD, BCPS Clinical Pharmacist 07/20/2016,2:29 PM

## 2016-07-20 NOTE — Discharge Instructions (Signed)
Fluid restriction 1500 ml in 24 hours, Decrease salt intake,.

## 2016-07-20 NOTE — Care Management (Signed)
Patient is to discharge home on Entresto and CM provided patient with 30 day coupon. Confirmed he does have medicare pharmacy coverage

## 2016-09-01 ENCOUNTER — Ambulatory Visit: Payer: Medicare Other | Admitting: Certified Registered Nurse Anesthetist

## 2016-09-01 ENCOUNTER — Ambulatory Visit
Admission: RE | Admit: 2016-09-01 | Discharge: 2016-09-01 | Disposition: A | Discharge status: Home / Self Care | Payer: Medicare Other | Source: Ambulatory Visit | Attending: Cardiology | Admitting: Cardiology

## 2016-09-01 ENCOUNTER — Encounter: Payer: Self-pay | Admitting: *Deleted

## 2016-09-01 ENCOUNTER — Encounter
Admission: RE | Disposition: A | Discharge status: Home / Self Care | Payer: Self-pay | Source: Ambulatory Visit | Attending: Cardiology

## 2016-09-01 DIAGNOSIS — Z951 Presence of aortocoronary bypass graft: Secondary | ICD-10-CM | POA: Diagnosis not present

## 2016-09-01 DIAGNOSIS — I255 Ischemic cardiomyopathy: Secondary | ICD-10-CM | POA: Diagnosis not present

## 2016-09-01 DIAGNOSIS — I10 Essential (primary) hypertension: Secondary | ICD-10-CM | POA: Diagnosis not present

## 2016-09-01 DIAGNOSIS — I110 Hypertensive heart disease with heart failure: Secondary | ICD-10-CM | POA: Insufficient documentation

## 2016-09-01 DIAGNOSIS — Z7901 Long term (current) use of anticoagulants: Secondary | ICD-10-CM | POA: Diagnosis not present

## 2016-09-01 DIAGNOSIS — E785 Hyperlipidemia, unspecified: Secondary | ICD-10-CM | POA: Diagnosis not present

## 2016-09-01 DIAGNOSIS — Z79899 Other long term (current) drug therapy: Secondary | ICD-10-CM | POA: Insufficient documentation

## 2016-09-01 DIAGNOSIS — I509 Heart failure, unspecified: Secondary | ICD-10-CM | POA: Insufficient documentation

## 2016-09-01 DIAGNOSIS — I481 Persistent atrial fibrillation: Secondary | ICD-10-CM | POA: Diagnosis not present

## 2016-09-01 DIAGNOSIS — E1151 Type 2 diabetes mellitus with diabetic peripheral angiopathy without gangrene: Secondary | ICD-10-CM | POA: Diagnosis not present

## 2016-09-01 DIAGNOSIS — Z7982 Long term (current) use of aspirin: Secondary | ICD-10-CM | POA: Insufficient documentation

## 2016-09-01 DIAGNOSIS — I2510 Atherosclerotic heart disease of native coronary artery without angina pectoris: Secondary | ICD-10-CM | POA: Insufficient documentation

## 2016-09-01 DIAGNOSIS — I252 Old myocardial infarction: Secondary | ICD-10-CM | POA: Diagnosis not present

## 2016-09-01 HISTORY — DX: Atherosclerotic heart disease of native coronary artery without angina pectoris: I25.10

## 2016-09-01 HISTORY — PX: CARDIOVERSION: EP1203

## 2016-09-01 LAB — GLUCOSE, CAPILLARY: Glucose-Capillary: 141 mg/dL — ABNORMAL HIGH (ref 65–99)

## 2016-09-01 SURGERY — CARDIOVERSION (CATH LAB)
Anesthesia: General

## 2016-09-01 MED ORDER — PROPOFOL 10 MG/ML IV BOLUS
INTRAVENOUS | Status: DC | PRN
  Administered 2016-09-01: 40 mg via INTRAVENOUS

## 2016-09-01 MED ORDER — PROPOFOL 10 MG/ML IV BOLUS
INTRAVENOUS | Status: AC
  Filled 2016-09-01: qty 20

## 2016-09-01 MED ORDER — SODIUM CHLORIDE 0.9 % IV SOLN
INTRAVENOUS | Status: DC | PRN
  Administered 2016-09-01: 07:00:00 via INTRAVENOUS

## 2016-09-01 NOTE — Anesthesia Post-op Follow-up Note (Cosign Needed)
Anesthesia QCDR form completed.        

## 2016-09-01 NOTE — Anesthesia Postprocedure Evaluation (Signed)
Anesthesia Post Note  Patient: Ok AnisJohnny R Pilant  Procedure(s) Performed: Procedure(s) (LRB): CARDIOVERSION (N/A)  Patient location during evaluation: PACU Anesthesia Type: General Level of consciousness: awake, awake and alert and oriented Pain management: pain level controlled Vital Signs Assessment: post-procedure vital signs reviewed and stable Respiratory status: spontaneous breathing, nonlabored ventilation and respiratory function stable Cardiovascular status: blood pressure returned to baseline and stable Anesthetic complications: no     Last Vitals:  Vitals:   09/01/16 0743 09/01/16 0744  BP:  (!) 107/49  Pulse: 71 72  Resp: 15 14  Temp:      Last Pain:  Vitals:   09/01/16 0640  TempSrc: Oral                 Ginger CarneStephanie Marquan Vokes

## 2016-09-01 NOTE — Anesthesia Procedure Notes (Signed)
Date/Time: 09/01/2016 7:30 AM Performed by: Ginger CarneMICHELET, Patrick Skinner Pre-anesthesia Checklist: Emergency Drugs available, Suction available, Patient identified, Patient being monitored and Timeout performed Patient Re-evaluated:Patient Re-evaluated prior to inductionOxygen Delivery Method: Nasal cannula

## 2016-09-01 NOTE — Final Progress Note (Signed)
Pt presented to elective outpatient DCCV.  Procedure:  DCCV Indication: Symptomatic afib Sedation: propofol sedation Per dept of anesthesia  After informed consent, time out protocol and adequate sedation, pt received 120J of synchronized, biphasic shock to nsr. No immediate complications.

## 2016-09-01 NOTE — H&P (Signed)
Chief Complaint: Chief Complaint  Patient presents with  . 3 weeks follow up  sob-again and coughing  . Fatigue  has gained 15 lbs in 1 week  Date of Service: 08/26/2016 Date of Birth: 06/11/1946 PCP: NONE  History of Present Illness: Patrick Skinner is a 71 y.o.male patient who presents in follow-up visit. [place] and was visiting his daughters in our area when he developed what he felt was rather rapid onset of shortness of breath and peripheral edema. [facility] where echocardiogram revealed ejection fraction of 2025%. He was aggressively diuresed of approximately 15-20 pounds. He was placed on Entresto. He was noted to be in atrial fibrillation and was anticoagulated with Eliquis. It is of note that the patient had transient atrial fibrillation at the time of his coronary artery bypass grafting done [facility] in the 1990s. He has done fairly well without dysrhythmia or symptoms since that time. He now since discharge has felt a little better although still has peripheral edema still has shortness of breath. EKG shows persistent atrial fibrillation with good rate control. He was started on amiodarone load. Continue with Entresto as well as low-dose beta-blocker and diuresis. Daily weights and avoiding sodium was also discussed. Echo during the hospitalization suggested possible LV thrombus however this was not well appreciated. Repeat echo revealed similar EF. No obvious apical thrombus noted in the study. He remains on his current regimen. He still has shortness of breath. His weight has fairly stable until recently he has gained 10-15 pounds. He admits to being out of Spironolactone. He states he is compliant with Entresto and Lasix. He still is short of breath. EKG today reveals atrial fibrillation at a rate of 70 bpm with a QRS duration of 138 ms and a QTC of 462 ms. There is no ischemia.  Past Medical and Surgical History  Past Medical  History History reviewed. No pertinent past medical history.  Past Surgical History He has no past surgical history on file.   Medications and Allergies  Current Medications  Current Outpatient Prescriptions  Medication Sig Dispense Refill  . AMIOdarone (PACERONE) 200 MG tablet Take 1 tablet (200 mg total) by mouth 2 (two) times daily. 400 mg bid for 1 week; 200 mg bid for 3 weeks and then 200 mg daily 60 tablet 11  . apixaban (ELIQUIS) 5 mg tablet Take 1 tablet (5 mg total) by mouth every 12 (twelve) hours. 60 tablet 0  . aspirin 81 MG EC tablet Take by mouth.  . carvedilol (COREG) 6.25 MG tablet Take 1 tablet (6.25 mg total) by mouth 2 (two) times daily with meals. 60 tablet 0  . diazePAM (VALIUM) 5 MG tablet Take 1 tablet (5 mg total) by mouth every 12 (twelve) hours as needed for Anxiety. 30 tablet 3  . ergocalciferol, vitamin D2, 50,000 unit capsule Take by mouth.  . FUROsemide (LASIX) 40 MG tablet Take 1 tablet (40 mg total) by mouth once daily. 30 tablet 3  . gabapentin (NEURONTIN) 600 MG tablet Take 600 mg in am and 900 mg at bedtime 75 tablet 0  . HYDROcodone-acetaminophen (NORCO) 5-325 mg tablet Take 1 tab q6 hours prn  . insulin degludec (TRESIBA FLEXTOUCH) injection (concentration 100 units/mL) Inject subcutaneously.  . potassium chloride (K-DUR, KLOR-CON) 10 mEq ER tablet Take by mouth.  . sacubitril-valsartan (ENTRESTO) 24-26 mg tablet Take 1 tablet by mouth 2 (two) times daily. 60 tablet 0  . simvastatin (ZOCOR)  40 MG tablet Take by mouth.  . spironolactone (ALDACTONE) 25 MG tablet Take 1 tablet (25 mg total) by mouth once daily. 30 tablet 3   No current facility-administered medications for this visit.   Allergies: Patient has no known allergies.  Social and Family History  Social History   Family History History reviewed. No pertinent family history.  Review of Systems  Review of Systems  Constitutional: Positive for malaise/fatigue. Negative for chills,  diaphoresis, fever and weight loss.  HENT: Negative for congestion, ear discharge, hearing loss and tinnitus.  Eyes: Negative for blurred vision.  Respiratory: Positive for shortness of breath. Negative for cough, hemoptysis, sputum production and wheezing.  Cardiovascular: Positive for palpitations and leg swelling. Negative for chest pain, orthopnea, claudication and PND.  Gastrointestinal: Negative for abdominal pain, blood in stool, constipation, diarrhea, heartburn, melena, nausea and vomiting.  Genitourinary: Negative for dysuria, frequency, hematuria and urgency.  Musculoskeletal: Negative for back pain, falls, joint pain and myalgias.  Skin: Negative for itching and rash.  Neurological: Negative for dizziness, tingling, focal weakness, loss of consciousness, weakness and headaches.  Endo/Heme/Allergies: Negative for polydipsia. Does not bruise/bleed easily.  Psychiatric/Behavioral: Negative for depression, memory loss and substance abuse. The patient is not nervous/anxious.   Physical Examination   Vitals:BP 110/70  Pulse 76  Resp 12  Ht 180.3 cm (5\' 11" )  Wt (!) 111.6 kg (246 lb)  BMI 34.31 kg/m  Ht:180.3 cm (5\' 11" ) Wt:(!) 111.6 kg (246 lb) ZOX:WRUE surface area is 2.36 meters squared. Body mass index is 34.31 kg/m.  Wt Readings from Last 3 Encounters:  08/26/16 (!) 111.6 kg (246 lb)  08/03/16 (!) 105.2 kg (232 lb)  07/28/16 (!) 106.6 kg (235 lb)   BP Readings from Last 3 Encounters:  08/26/16 110/70  08/03/16 122/80  07/28/16 122/70   General appearance appears in no acute distress  Head Mouth and Eye exam Normocephalic, without obvious abnormality, atraumatic Dentition is good Eyes appear anicteric   Neck exam Thyroid: normal  Nodes: no obvious adenopathy  LUNGS Breath Sounds: Normal Percussion: Normal  CARDIOVASCULAR JVP CV wave: no HJR: no Elevation at 90 degrees: None Carotid Pulse: normal pulsation bilaterally Bruit: None Apex: apical  impulse normal  Auscultation Rhythm: atrial fibrillation S1: normal S2: normal Clicks: no Rub: no Murmurs: no murmurs  Gallop: None ABDOMEN Liver enlargement: no Pulsatile aorta: no Ascites: no Bruits: no  EXTREMITIES Clubbing: no Edema: 2+ bilateral pedal edema Pulses: peripheral pulses symmetrical Femoral Bruits: no Amputation: no SKIN Rash: no Cyanosis: no Embolic phemonenon: no Bruising: no NEURO Alert and Oriented to person, place and time: yes Non focal: yes  PSYCH: Pt appears to have normal affect  LABS REVIEWED  Lab Results  Component Value Date  CREATININE 1.3 07/28/2016  BUN 23 07/28/2016  NA 140 07/28/2016  K 4.9 07/28/2016  CL 100 07/28/2016  CO2 31.7 07/28/2016   Diagnostic Studies Reviewed:  EKG EKG demonstrated atrial fibrillation, rate 70.  Assessment and Plan   71 y.o. male with  ICD-10-CM ICD-9-CM  1. Atrial fibrillation, unspecified type (CMS-HCC)-continue with current regimen including carvedilol for rate control. Long discussion regarding antiarrhythmic drugs. Would recommend amiodarone in this situation however family is somewhat concerned about potential side effects. They will discuss this. Will continue with anticoagulation and amiodarone. Will attempt cardioversion next week. I48.91 427.31  2. Familial hyperlipidemia continue with current regimen E78.4 272.4  3. Persistent atrial fibrillation (CMS-HCC) I48.1 427.31  4. Coronary artery disease of native artery of native heart  with stable angina pectoris (CMS-HCC)-we will need to consider ischemic workup once his A. fib and CHF is stabilized I25.118 414.01  413.9  5. 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 Currently stable. Will resume spironolactone and continue with furosemide. Low-sodium diet is stressed. I25.5 414.8    Return in about 2 weeks (around 09/09/2016).  These notes generated with voice recognition software. I apologize for typographical  errors.  Patrick Ar, MD    H and P reveiwed. Pt examined No changes.

## 2016-09-01 NOTE — Transfer of Care (Signed)
Immediate Anesthesia Transfer of Care Note  Patient: Patrick Skinner  Procedure(s) Performed: Procedure(s): CARDIOVERSION (N/A)  Patient Location: PACU  Anesthesia Type:General  Level of Consciousness: awake, alert  and oriented  Airway & Oxygen Therapy: Patient Spontanous Breathing and Patient connected to nasal cannula oxygen  Post-op Assessment: Report given to RN and Post -op Vital signs reviewed and stable  Post vital signs: Reviewed and stable  Last Vitals:  Vitals:   09/01/16 0743 09/01/16 0744  BP:  (!) 107/49  Pulse: 71 72  Resp: 15 14  Temp:      Last Pain:  Vitals:   09/01/16 0640  TempSrc: Oral         Complications: No apparent anesthesia complications

## 2016-09-01 NOTE — Anesthesia Preprocedure Evaluation (Signed)
Anesthesia Evaluation  Patient identified by MRN, date of birth, ID band Patient awake    Reviewed: Allergy & Precautions, H&P , NPO status , Patient's Chart, lab work & pertinent test results, reviewed documented beta blocker date and time   History of Anesthesia Complications Negative for: history of anesthetic complications  Airway Mallampati: III  TM Distance: >3 FB Neck ROM: full    Dental no notable dental hx. (+) Partial Upper, Poor Dentition   Pulmonary shortness of breath and with exertion, neg sleep apnea, neg COPD, neg recent URI, Current Smoker,           Cardiovascular Exercise Tolerance: Good hypertension, (-) angina+ CAD, + Past MI, + CABG, + Peripheral Vascular Disease and +CHF  (-) Cardiac Stents + dysrhythmias Atrial Fibrillation (-) Valvular Problems/Murmurs  Ruptured AAA s/p repair in 2011 or 2012   Neuro/Psych negative neurological ROS  negative psych ROS   GI/Hepatic negative GI ROS, Neg liver ROS,   Endo/Other  diabetes  Renal/GU CRFRenal disease  negative genitourinary   Musculoskeletal   Abdominal   Peds  Hematology negative hematology ROS (+)   Anesthesia Other Findings Past Medical History: No date: Aortic aneurysm (HCC) No date: CHF (congestive heart failure) (HCC) No date: Coronary artery disease No date: Diabetes mellitus without complication (HCC) No date: MI (myocardial infarction) 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   Dental Advisory Given  Plan Discussed with: Anesthesiologist, CRNA and Surgeon  Anesthesia Plan Comments:         Anesthesia Quick Evaluation

## 2017-07-18 IMAGING — CR DG CHEST 2V
2 series · 2 of 2 positions shown · non-contrast
Comparison: None.

CLINICAL DATA: Shortness of breath and productive cough. Bilateral
lower extremity edema. History of coronary artery disease and CABG.

EXAM:
CHEST  2 VIEW

[chest pa]
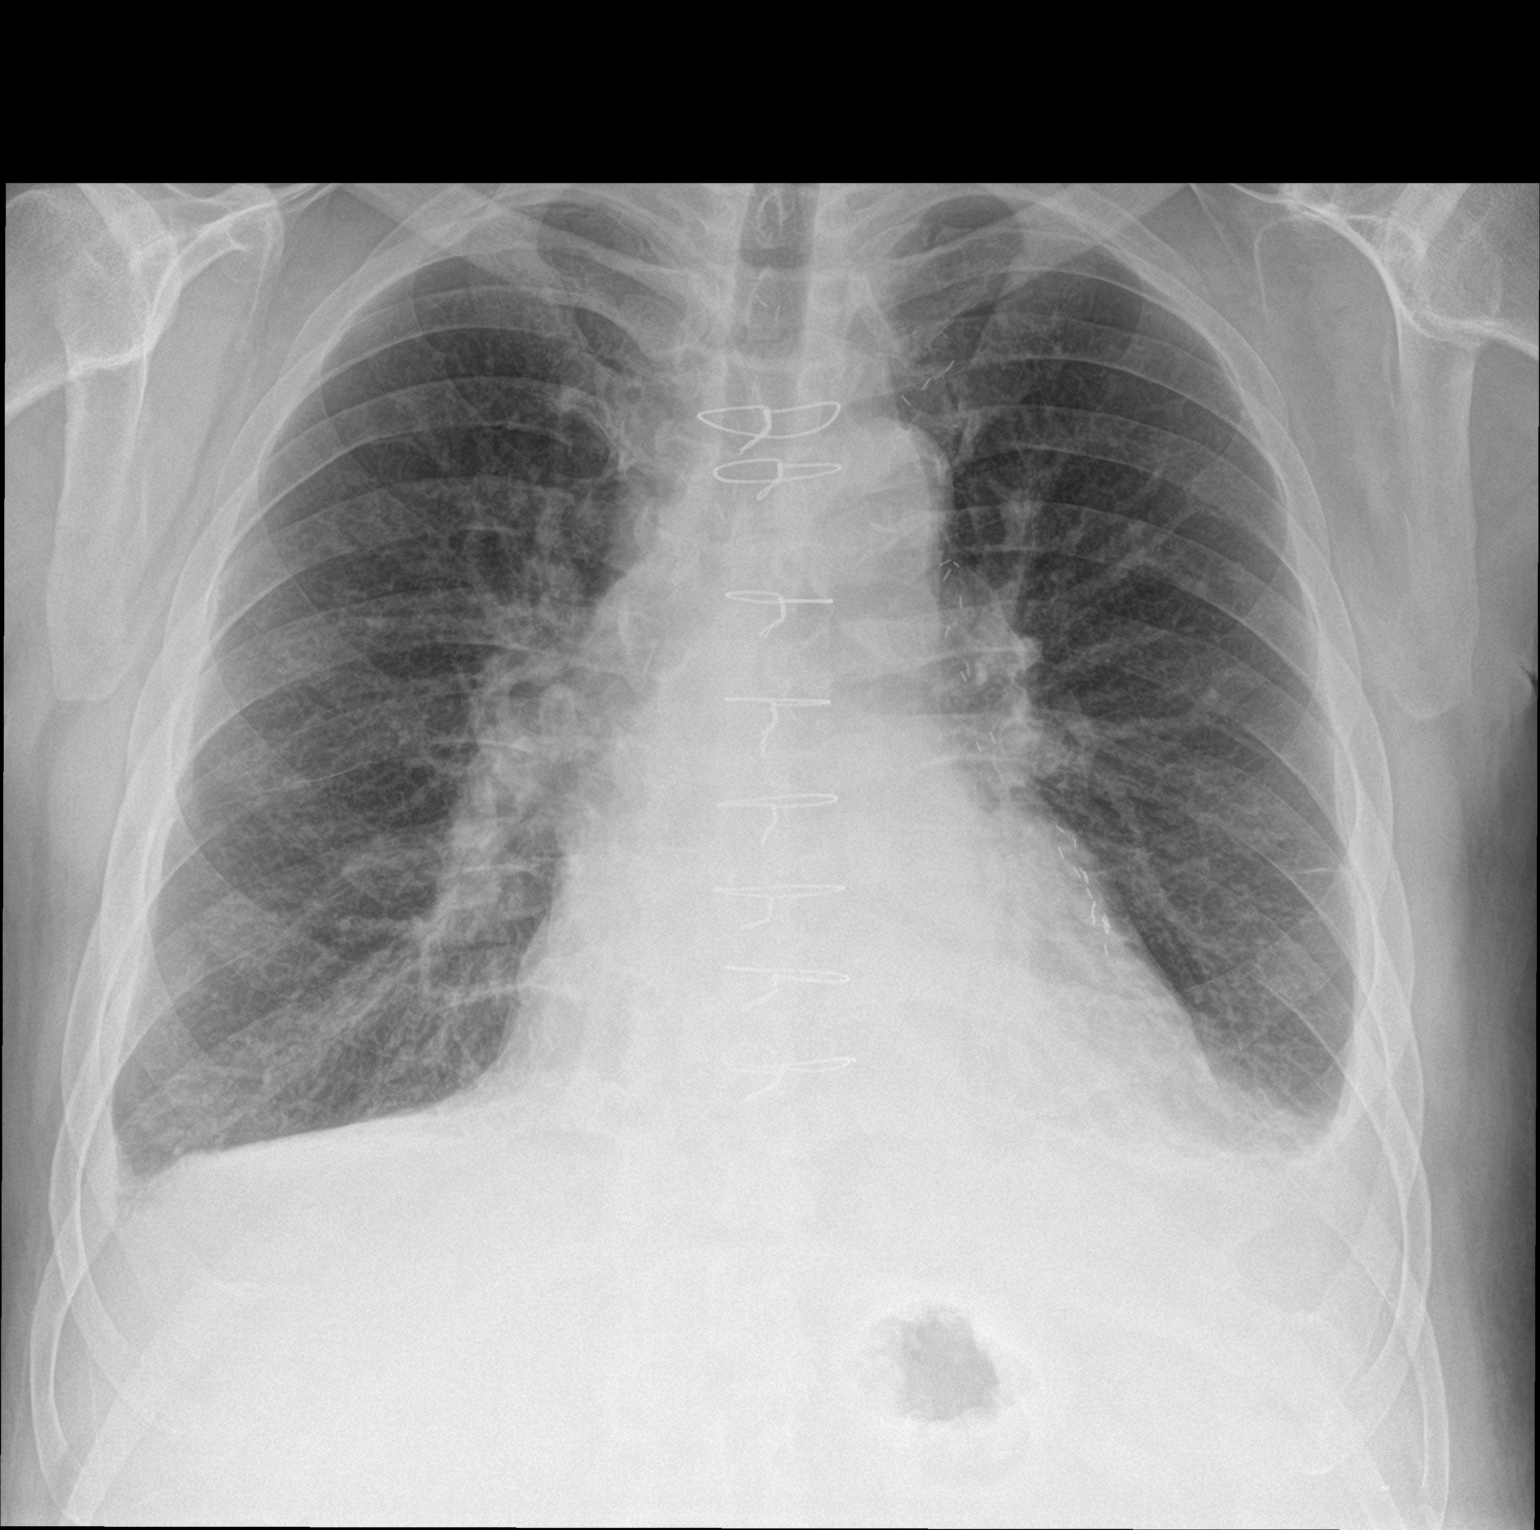

[chest lat]
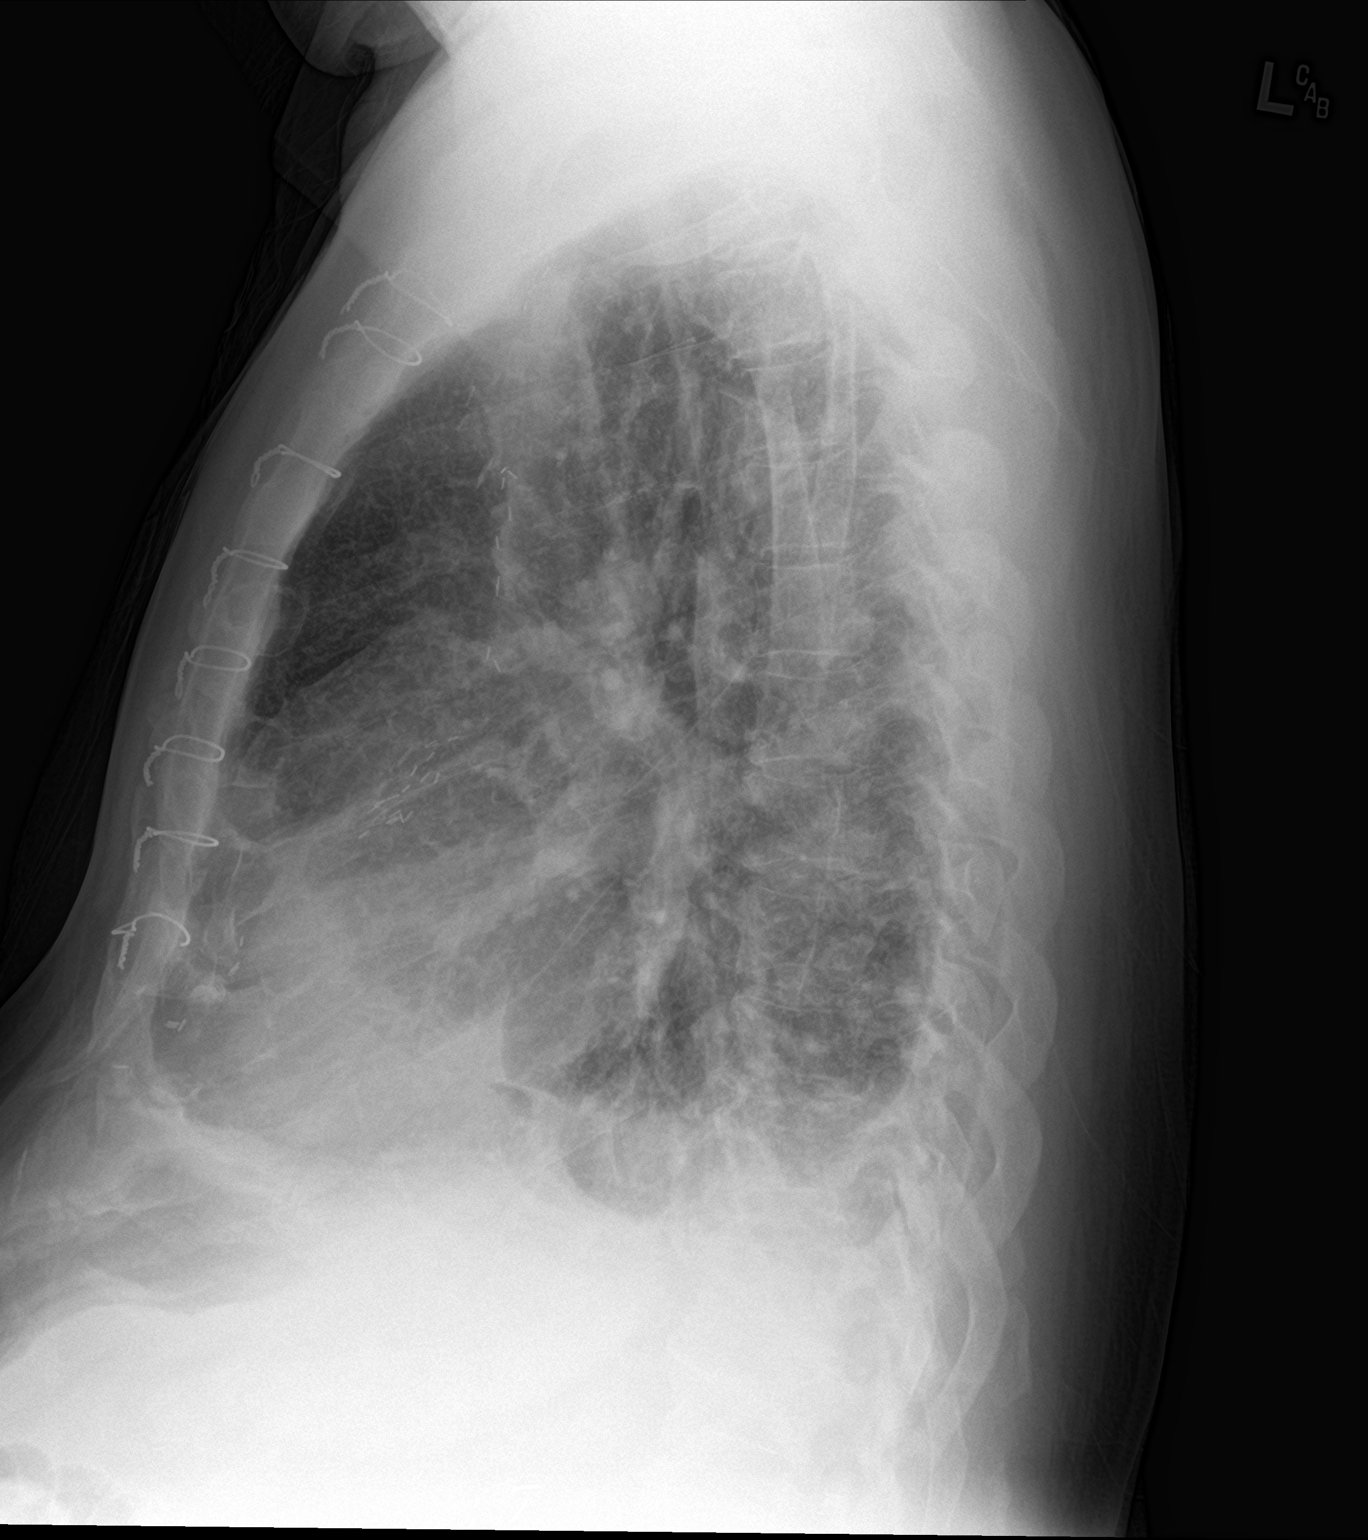

[2 of 2 positions shown; findings below may reference images not displayed]

FINDINGS: The heart is mildly enlarged and there is evidence of prior CABG.
Lungs show evidence of diffuse interstitial edema and small
bilateral pleural effusions, left greater than right. Bibasilar
atelectasis/ scarring present. No pneumothorax or nodules
identified. Visualized bony structures are unremarkable.
IMPRESSION: Diffuse interstitial edema with bilateral pleural effusions. Mild
cardiac enlargement and evidence of prior CABG.

## 2018-05-22 IMAGING — US US EXTREM LOW VENOUS BILAT
1 series · 13 of 24 positions shown · non-contrast
Comparison: No prior.

CLINICAL DATA: Lower extremity swelling.



[Series 1: us extrem low venous bilat · 0.08mm/px · 13 of 66 slices shown]
[im 1/66]
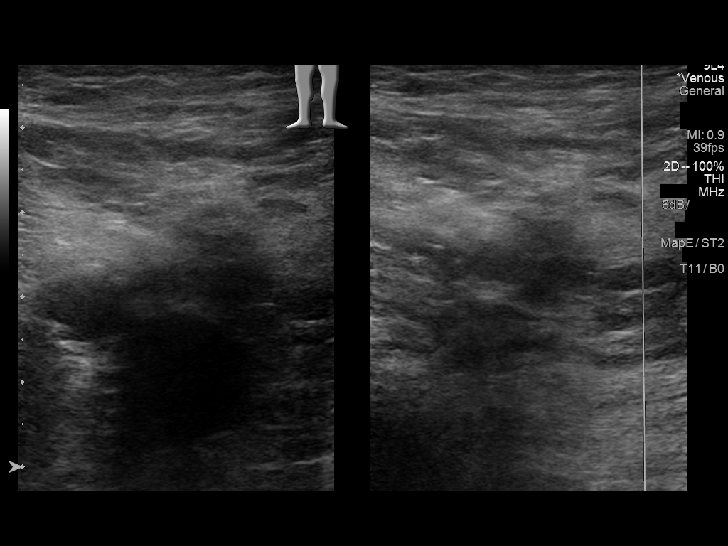
[im 6/66]
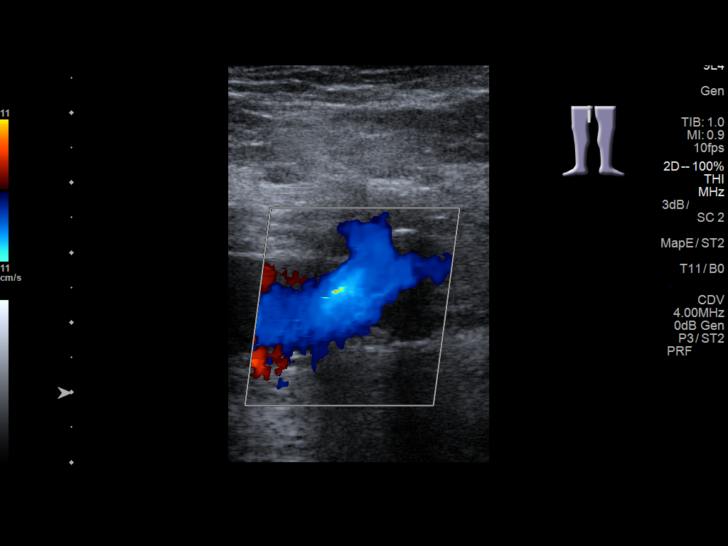
[im 12/66]
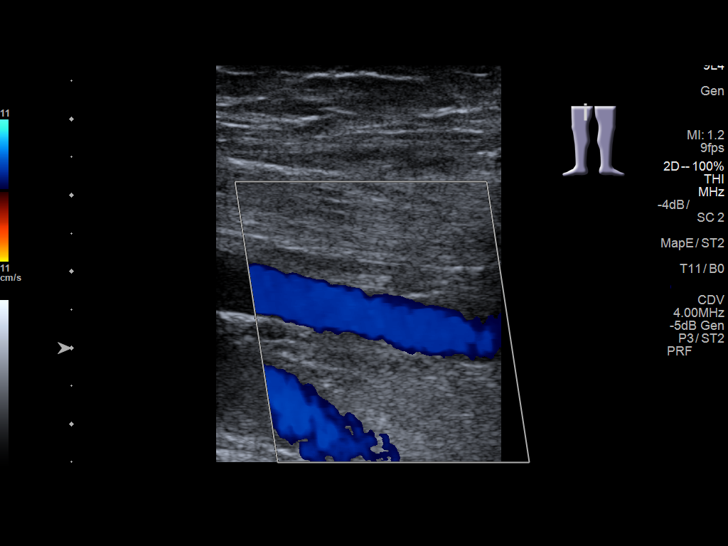
[im 17/66]
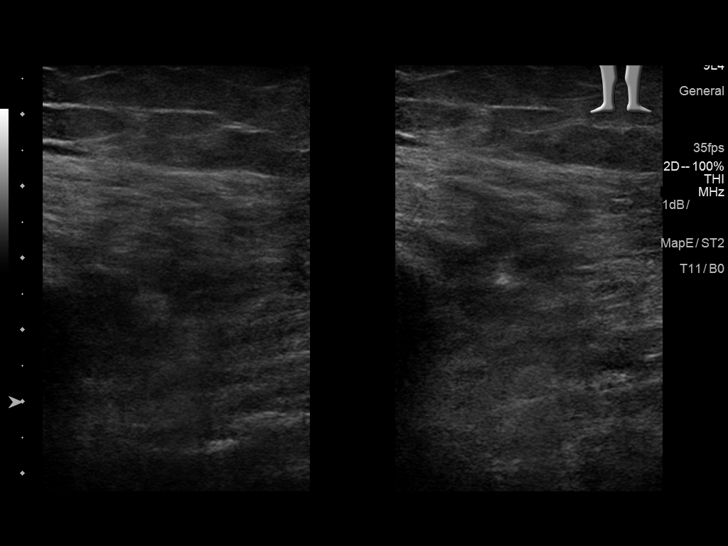
[im 23/66]
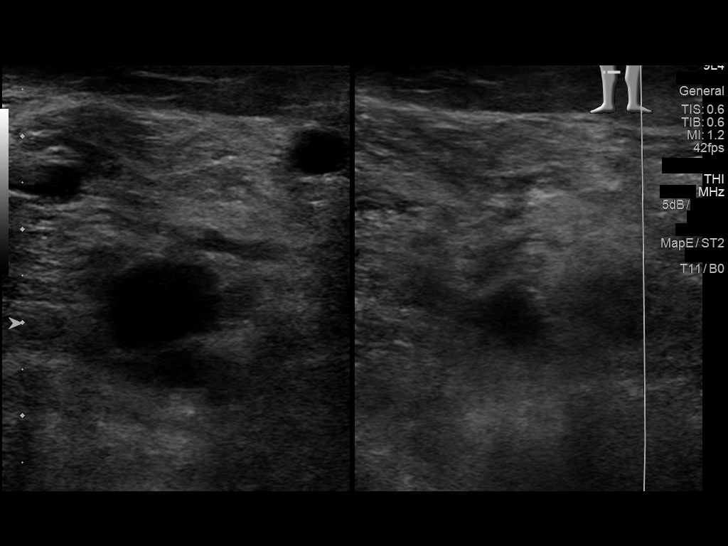
[im 29/66]
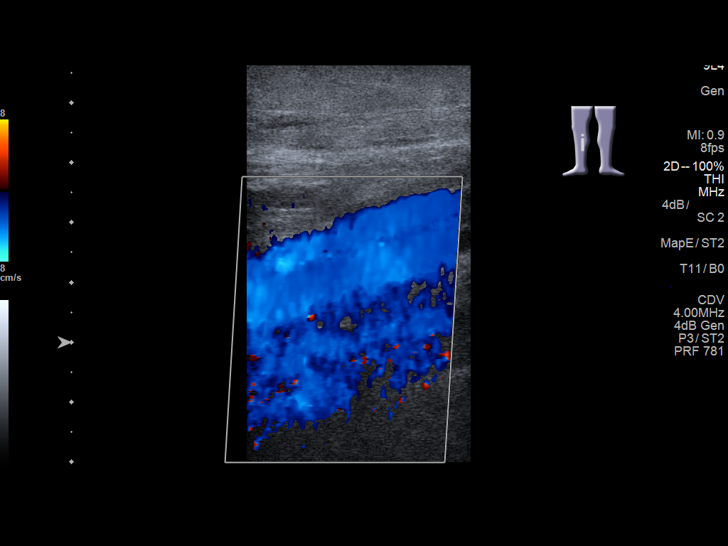
[im 34/66]
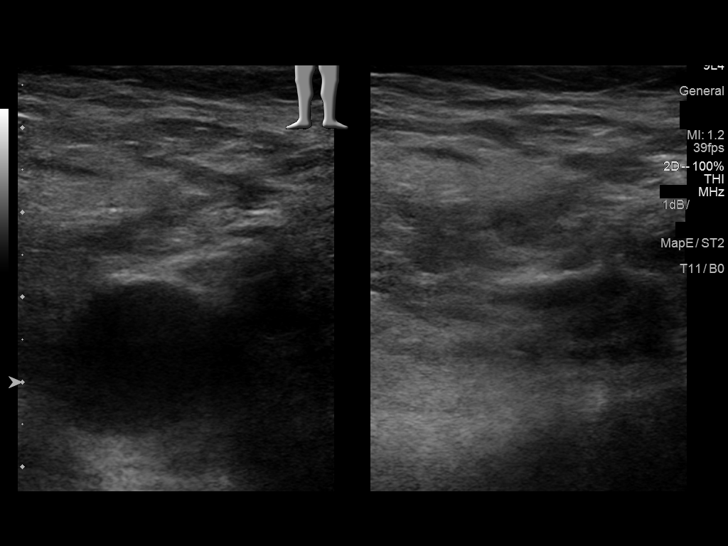
[im 37/66]
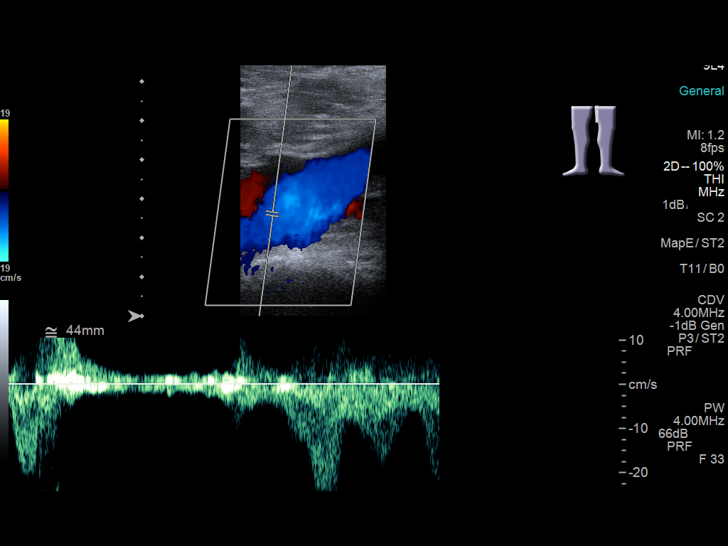
[im 43/66]
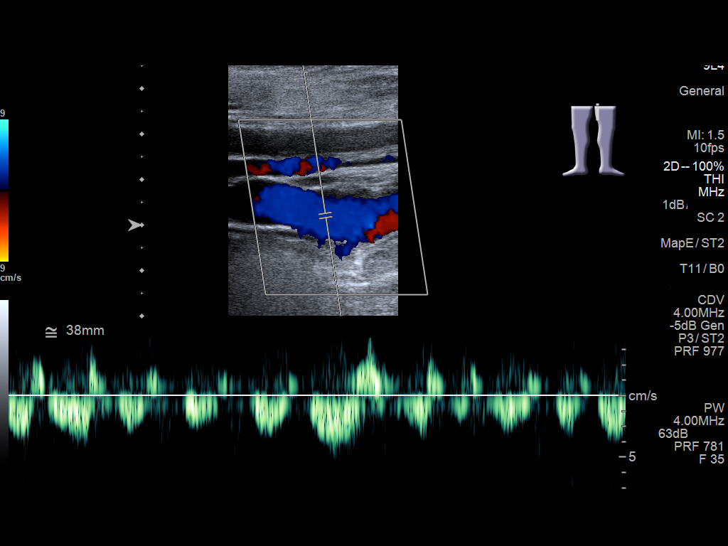
[im 49/66]
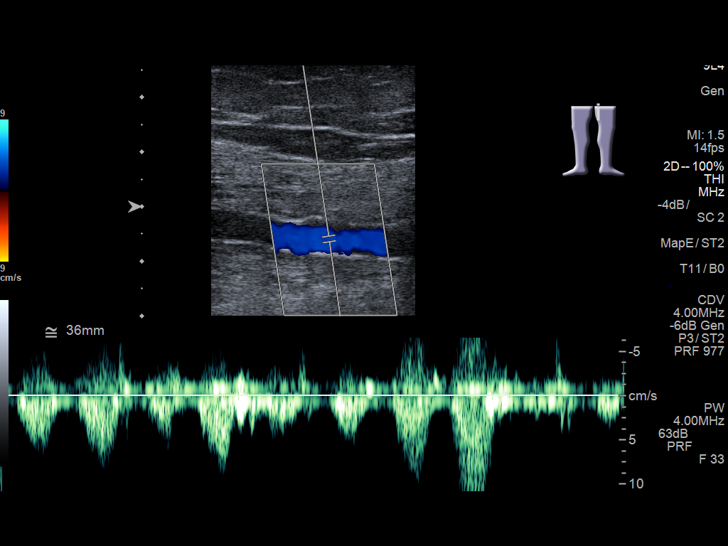
[im 54/66]
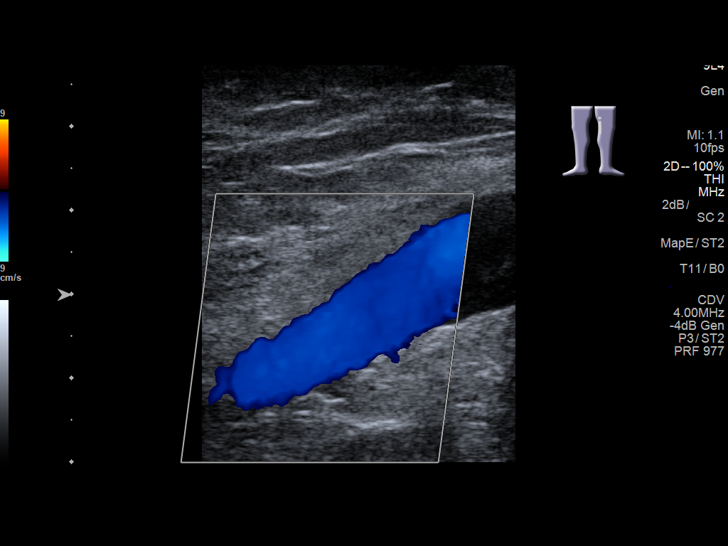
[im 60/66]
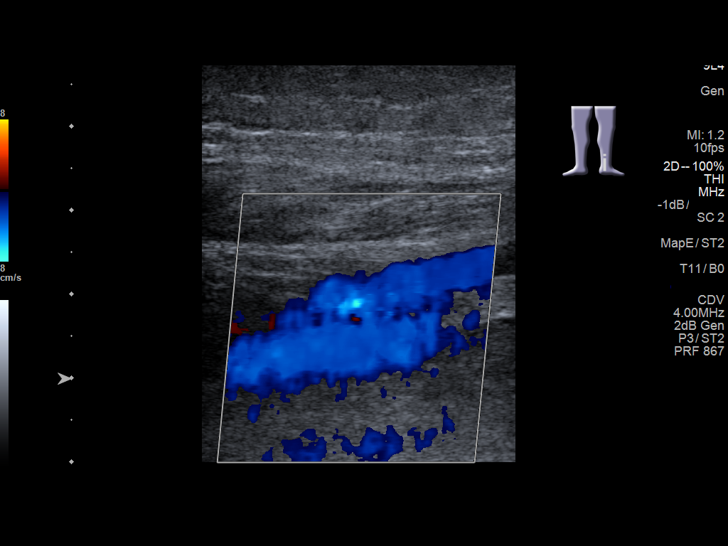
[im 66/66]
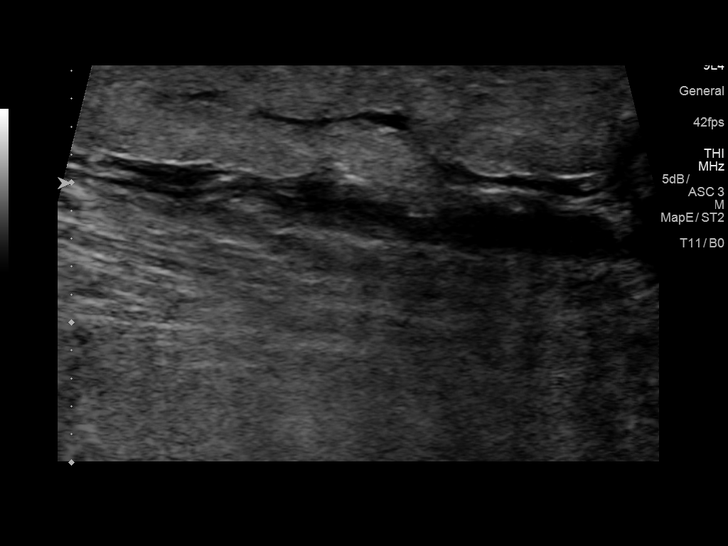

[13 of 24 positions shown; findings below may reference images not displayed]

FINDINGS: RIGHT LOWER EXTREMITY

Common Femoral Vein: No evidence of thrombus. Normal
compressibility, respiratory phasicity and response to augmentation.

Saphenofemoral Junction: No evidence of thrombus. Normal
compressibility and flow on color Doppler imaging.

Profunda Femoral Vein: No evidence of thrombus. Normal
compressibility and flow on color Doppler imaging.

Femoral Vein: No evidence of thrombus. Normal compressibility,
respiratory phasicity and response to augmentation.

Popliteal Vein: No evidence of thrombus. Normal compressibility,
respiratory phasicity and response to augmentation.

Calf Veins: No evidence of thrombus. Normal compressibility and flow
on color Doppler imaging.

Superficial Great Saphenous Vein: No evidence of thrombus. Normal
compressibility and flow on color Doppler imaging.

Venous Reflux:  None.

Other Findings:  Soft tissue edema noted over the calf .

LEFT LOWER EXTREMITY

Common Femoral Vein: No evidence of thrombus. Normal
compressibility, respiratory phasicity and response to augmentation.

Saphenofemoral Junction: No evidence of thrombus. Normal
compressibility and flow on color Doppler imaging.

Profunda Femoral Vein: No evidence of thrombus. Normal
compressibility and flow on color Doppler imaging.

Femoral Vein: No evidence of thrombus. Normal compressibility,
respiratory phasicity and response to augmentation.

Popliteal Vein: No evidence of thrombus. Normal compressibility,
respiratory phasicity and response to augmentation.

Calf Veins: No evidence of thrombus. Normal compressibility and flow
on color Doppler imaging.

Superficial Great Saphenous Vein: No evidence of thrombus. Normal
compressibility and flow on color Doppler imaging.

Venous Reflux:  None.

Other Findings:  Soft tissue edema noted over the calf.
IMPRESSION: No evidence of deep venous thrombosis.

## 2020-02-11 DEATH — deceased
